# Patient Record
Sex: Female | Born: 1970 | Race: Asian | Hispanic: No | Marital: Married | Smoking: Never smoker
Health system: Southern US, Community
[De-identification: ages and names within clinical notes are randomized; demographics above are authoritative.]

## PROBLEM LIST (undated history)

## (undated) DIAGNOSIS — E559 Vitamin D deficiency, unspecified: Secondary | ICD-10-CM

## (undated) DIAGNOSIS — I10 Essential (primary) hypertension: Secondary | ICD-10-CM

## (undated) DIAGNOSIS — J45909 Unspecified asthma, uncomplicated: Secondary | ICD-10-CM

## (undated) DIAGNOSIS — J302 Other seasonal allergic rhinitis: Secondary | ICD-10-CM

## (undated) DIAGNOSIS — Z973 Presence of spectacles and contact lenses: Secondary | ICD-10-CM

## (undated) DIAGNOSIS — K589 Irritable bowel syndrome without diarrhea: Secondary | ICD-10-CM

## (undated) DIAGNOSIS — N289 Disorder of kidney and ureter, unspecified: Secondary | ICD-10-CM

## (undated) DIAGNOSIS — J4520 Mild intermittent asthma, uncomplicated: Secondary | ICD-10-CM

## (undated) DIAGNOSIS — K219 Gastro-esophageal reflux disease without esophagitis: Secondary | ICD-10-CM

## (undated) DIAGNOSIS — E119 Type 2 diabetes mellitus without complications: Secondary | ICD-10-CM

## (undated) DIAGNOSIS — J309 Allergic rhinitis, unspecified: Secondary | ICD-10-CM

## (undated) HISTORY — PX: FOOT SURGERY: SHX648

## (undated) HISTORY — DX: Type 2 diabetes mellitus without complications: E11.9

## (undated) HISTORY — PX: NO PAST SURGERIES: SHX2092

---

## 1999-09-17 ENCOUNTER — Emergency Department (HOSPITAL_COMMUNITY): Admission: EM | Admit: 1999-09-17 | Discharge: 1999-09-17 | Payer: Self-pay | Admitting: Emergency Medicine

## 2000-05-07 ENCOUNTER — Inpatient Hospital Stay (HOSPITAL_COMMUNITY): Admission: AD | Admit: 2000-05-07 | Discharge: 2000-05-07 | Payer: Self-pay | Admitting: *Deleted

## 2001-01-22 ENCOUNTER — Ambulatory Visit (HOSPITAL_COMMUNITY): Admission: RE | Admit: 2001-01-22 | Discharge: 2001-01-22 | Payer: Self-pay | Admitting: *Deleted

## 2001-03-28 ENCOUNTER — Ambulatory Visit (HOSPITAL_COMMUNITY): Admission: RE | Admit: 2001-03-28 | Discharge: 2001-03-28 | Payer: Self-pay | Admitting: *Deleted

## 2001-08-17 ENCOUNTER — Inpatient Hospital Stay (HOSPITAL_COMMUNITY): Admission: AD | Admit: 2001-08-17 | Discharge: 2001-08-19 | Payer: Self-pay | Admitting: *Deleted

## 2002-08-04 ENCOUNTER — Encounter: Payer: Self-pay | Admitting: Internal Medicine

## 2002-08-04 ENCOUNTER — Ambulatory Visit (HOSPITAL_COMMUNITY): Admission: RE | Admit: 2002-08-04 | Discharge: 2002-08-04 | Payer: Self-pay | Admitting: Internal Medicine

## 2003-01-11 ENCOUNTER — Ambulatory Visit (HOSPITAL_COMMUNITY): Admission: RE | Admit: 2003-01-11 | Discharge: 2003-01-11 | Payer: Self-pay | Admitting: Internal Medicine

## 2003-05-18 ENCOUNTER — Ambulatory Visit (HOSPITAL_COMMUNITY): Admission: RE | Admit: 2003-05-18 | Discharge: 2003-05-18 | Payer: Self-pay | Admitting: Internal Medicine

## 2003-09-06 ENCOUNTER — Emergency Department (HOSPITAL_COMMUNITY): Admission: EM | Admit: 2003-09-06 | Discharge: 2003-09-06 | Payer: Self-pay

## 2003-09-07 ENCOUNTER — Emergency Department (HOSPITAL_COMMUNITY): Admission: EM | Admit: 2003-09-07 | Discharge: 2003-09-08 | Payer: Self-pay | Admitting: Emergency Medicine

## 2003-09-08 ENCOUNTER — Emergency Department (HOSPITAL_COMMUNITY): Admission: EM | Admit: 2003-09-08 | Discharge: 2003-09-08 | Payer: Self-pay | Admitting: Emergency Medicine

## 2003-12-07 ENCOUNTER — Ambulatory Visit: Payer: Self-pay | Admitting: *Deleted

## 2003-12-07 ENCOUNTER — Ambulatory Visit: Payer: Self-pay | Admitting: Nurse Practitioner

## 2004-02-01 ENCOUNTER — Ambulatory Visit: Payer: Self-pay | Admitting: Nurse Practitioner

## 2004-04-20 ENCOUNTER — Ambulatory Visit: Payer: Self-pay | Admitting: Nurse Practitioner

## 2004-05-02 ENCOUNTER — Ambulatory Visit: Payer: Self-pay | Admitting: Nurse Practitioner

## 2004-05-03 ENCOUNTER — Ambulatory Visit (HOSPITAL_COMMUNITY): Admission: RE | Admit: 2004-05-03 | Discharge: 2004-05-03 | Payer: Self-pay | Admitting: Internal Medicine

## 2004-05-17 ENCOUNTER — Ambulatory Visit: Payer: Self-pay | Admitting: Internal Medicine

## 2004-05-18 ENCOUNTER — Ambulatory Visit: Payer: Self-pay | Admitting: Internal Medicine

## 2004-05-19 ENCOUNTER — Ambulatory Visit: Payer: Self-pay | Admitting: Nurse Practitioner

## 2004-06-13 ENCOUNTER — Ambulatory Visit: Payer: Self-pay | Admitting: Obstetrics & Gynecology

## 2004-06-19 ENCOUNTER — Ambulatory Visit: Payer: Self-pay | Admitting: Nurse Practitioner

## 2004-06-19 ENCOUNTER — Ambulatory Visit (HOSPITAL_COMMUNITY): Admission: RE | Admit: 2004-06-19 | Discharge: 2004-06-19 | Payer: Self-pay | Admitting: Internal Medicine

## 2004-06-27 ENCOUNTER — Ambulatory Visit: Payer: Self-pay | Admitting: Obstetrics & Gynecology

## 2004-06-28 ENCOUNTER — Ambulatory Visit: Payer: Self-pay | Admitting: Nurse Practitioner

## 2004-07-25 ENCOUNTER — Ambulatory Visit: Payer: Self-pay | Admitting: Obstetrics & Gynecology

## 2004-08-29 ENCOUNTER — Ambulatory Visit: Payer: Self-pay | Admitting: Obstetrics and Gynecology

## 2004-10-11 ENCOUNTER — Ambulatory Visit: Payer: Self-pay | Admitting: Nurse Practitioner

## 2004-10-11 ENCOUNTER — Ambulatory Visit (HOSPITAL_COMMUNITY): Admission: RE | Admit: 2004-10-11 | Discharge: 2004-10-11 | Payer: Self-pay | Admitting: Nurse Practitioner

## 2004-10-30 ENCOUNTER — Ambulatory Visit (HOSPITAL_COMMUNITY): Admission: RE | Admit: 2004-10-30 | Discharge: 2004-10-30 | Payer: Self-pay | Admitting: Gastroenterology

## 2005-11-13 ENCOUNTER — Ambulatory Visit: Payer: Self-pay | Admitting: Nurse Practitioner

## 2005-12-10 ENCOUNTER — Other Ambulatory Visit: Admission: RE | Admit: 2005-12-10 | Discharge: 2005-12-10 | Payer: Self-pay | Admitting: Nurse Practitioner

## 2005-12-10 ENCOUNTER — Encounter (INDEPENDENT_AMBULATORY_CARE_PROVIDER_SITE_OTHER): Payer: Self-pay | Admitting: *Deleted

## 2005-12-10 ENCOUNTER — Ambulatory Visit: Payer: Self-pay | Admitting: Nurse Practitioner

## 2006-04-24 ENCOUNTER — Ambulatory Visit: Payer: Self-pay | Admitting: Nurse Practitioner

## 2006-05-15 ENCOUNTER — Ambulatory Visit: Payer: Self-pay | Admitting: Nurse Practitioner

## 2006-05-15 ENCOUNTER — Ambulatory Visit (HOSPITAL_COMMUNITY): Admission: RE | Admit: 2006-05-15 | Discharge: 2006-05-15 | Payer: Self-pay | Admitting: Nurse Practitioner

## 2006-06-10 ENCOUNTER — Ambulatory Visit: Payer: Self-pay | Admitting: Nurse Practitioner

## 2006-06-20 ENCOUNTER — Ambulatory Visit: Payer: Self-pay | Admitting: Nurse Practitioner

## 2006-11-06 ENCOUNTER — Encounter (INDEPENDENT_AMBULATORY_CARE_PROVIDER_SITE_OTHER): Payer: Self-pay | Admitting: *Deleted

## 2007-04-23 ENCOUNTER — Emergency Department (HOSPITAL_COMMUNITY): Admission: EM | Admit: 2007-04-23 | Discharge: 2007-04-23 | Payer: Self-pay | Admitting: Emergency Medicine

## 2007-05-08 ENCOUNTER — Emergency Department (HOSPITAL_COMMUNITY): Admission: EM | Admit: 2007-05-08 | Discharge: 2007-05-09 | Payer: Self-pay | Admitting: Emergency Medicine

## 2007-11-10 ENCOUNTER — Emergency Department (HOSPITAL_COMMUNITY): Admission: EM | Admit: 2007-11-10 | Discharge: 2007-11-11 | Payer: Self-pay | Admitting: Emergency Medicine

## 2010-03-12 ENCOUNTER — Encounter: Payer: Self-pay | Admitting: Obstetrics & Gynecology

## 2010-05-22 ENCOUNTER — Emergency Department (HOSPITAL_COMMUNITY): Payer: Self-pay

## 2010-05-22 ENCOUNTER — Inpatient Hospital Stay (HOSPITAL_COMMUNITY)
Admission: EM | Admit: 2010-05-22 | Discharge: 2010-05-27 | DRG: 690 | Disposition: A | Discharge status: Home / Self Care | Payer: Self-pay | Attending: Internal Medicine | Admitting: Internal Medicine

## 2010-05-22 DIAGNOSIS — I10 Essential (primary) hypertension: Secondary | ICD-10-CM | POA: Diagnosis present

## 2010-05-22 DIAGNOSIS — G43909 Migraine, unspecified, not intractable, without status migrainosus: Secondary | ICD-10-CM | POA: Diagnosis present

## 2010-05-22 DIAGNOSIS — D509 Iron deficiency anemia, unspecified: Secondary | ICD-10-CM | POA: Diagnosis present

## 2010-05-22 DIAGNOSIS — B9689 Other specified bacterial agents as the cause of diseases classified elsewhere: Secondary | ICD-10-CM | POA: Diagnosis present

## 2010-05-22 DIAGNOSIS — N12 Tubulo-interstitial nephritis, not specified as acute or chronic: Principal | ICD-10-CM | POA: Diagnosis present

## 2010-05-22 DIAGNOSIS — J45909 Unspecified asthma, uncomplicated: Secondary | ICD-10-CM | POA: Diagnosis present

## 2010-05-22 DIAGNOSIS — A499 Bacterial infection, unspecified: Secondary | ICD-10-CM | POA: Diagnosis present

## 2010-05-22 DIAGNOSIS — R109 Unspecified abdominal pain: Secondary | ICD-10-CM | POA: Diagnosis present

## 2010-05-22 DIAGNOSIS — N760 Acute vaginitis: Secondary | ICD-10-CM | POA: Diagnosis present

## 2010-05-22 DIAGNOSIS — R5381 Other malaise: Secondary | ICD-10-CM | POA: Diagnosis not present

## 2010-05-22 DIAGNOSIS — E860 Dehydration: Secondary | ICD-10-CM | POA: Diagnosis present

## 2010-05-22 DIAGNOSIS — R110 Nausea: Secondary | ICD-10-CM | POA: Diagnosis present

## 2010-05-22 DIAGNOSIS — E876 Hypokalemia: Secondary | ICD-10-CM | POA: Diagnosis present

## 2010-05-22 LAB — BASIC METABOLIC PANEL
BUN: 8 mg/dL (ref 6–23)
CO2: 27 mEq/L (ref 19–32)
Calcium: 9.2 mg/dL (ref 8.4–10.5)
Chloride: 104 mEq/L (ref 96–112)
Creatinine, Ser: 0.56 mg/dL (ref 0.4–1.2)
GFR calc Af Amer: >60 mL/min (ref >60)

## 2010-05-22 LAB — DIFFERENTIAL
Eosinophils Relative: 0 % (ref 0–5)
Lymphs Abs: 1.8 10*3/uL (ref 0.7–4.0)

## 2010-05-22 LAB — URINALYSIS, ROUTINE W REFLEX MICROSCOPIC: pH: 7 (ref 5.0–8.0)

## 2010-05-22 LAB — POCT PREGNANCY, URINE: Preg Test, Ur: NEGATIVE

## 2010-05-22 LAB — URINE MICROSCOPIC-ADD ON

## 2010-05-22 LAB — CBC
HCT: 33 % — ABNORMAL LOW (ref 36.0–46.0)
Hemoglobin: 11.1 g/dL — ABNORMAL LOW (ref 12.0–15.0)
MCV: 87.5 fL (ref 78.0–100.0)
Platelets: 264 10*3/uL (ref 150–400)
RBC: 3.77 MIL/uL — ABNORMAL LOW (ref 3.87–5.11)

## 2010-05-22 LAB — WET PREP, GENITAL: Yeast Wet Prep HPF POC: NONE SEEN

## 2010-05-23 ENCOUNTER — Other Ambulatory Visit (HOSPITAL_COMMUNITY): Payer: Self-pay

## 2010-05-23 ENCOUNTER — Inpatient Hospital Stay (HOSPITAL_COMMUNITY): Payer: Self-pay

## 2010-05-23 LAB — COMPREHENSIVE METABOLIC PANEL
ALT: 26 U/L (ref 0–35)
AST: 20 U/L (ref 0–37)
Alkaline Phosphatase: 63 U/L (ref 39–117)
BUN: 4 mg/dL — ABNORMAL LOW (ref 6–23)
CO2: 23 mEq/L (ref 19–32)
GFR calc Af Amer: >60 mL/min (ref >60)
Total Bilirubin: 0.5 mg/dL (ref 0.3–1.2)

## 2010-05-23 LAB — FOLATE: Folate: 13.7 ng/mL

## 2010-05-23 LAB — CBC
Hemoglobin: 9 g/dL — ABNORMAL LOW (ref 12.0–15.0)
MCH: 29.3 pg (ref 26.0–34.0)
RBC: 3.04 MIL/uL — ABNORMAL LOW (ref 3.87–5.11)

## 2010-05-23 LAB — IRON AND TIBC
Saturation Ratios: 4 % — ABNORMAL LOW (ref 20–55)
UIBC: 241 ug/dL

## 2010-05-23 LAB — DIFFERENTIAL
Basophils Absolute: 0 10*3/uL (ref 0.0–0.1)
Basophils Relative: 0 % (ref 0–1)
Eosinophils Absolute: 0.1 10*3/uL (ref 0.0–0.7)
Neutrophils Relative %: 68 % (ref 43–77)

## 2010-05-23 LAB — PATHOLOGIST SMEAR REVIEW

## 2010-05-23 LAB — FERRITIN: Ferritin: 65 ng/mL (ref 10–291)

## 2010-05-24 LAB — CARDIAC PANEL(CRET KIN+CKTOT+MB+TROPI)
Relative Index: INVALID (ref 0.0–2.5)
Relative Index: INVALID (ref 0.0–2.5)
Total CK: 32 U/L (ref 7–177)

## 2010-05-24 LAB — CBC
HCT: 32.4 % — ABNORMAL LOW (ref 36.0–46.0)
Hemoglobin: 10.6 g/dL — ABNORMAL LOW (ref 12.0–15.0)
MCH: 28.7 pg (ref 26.0–34.0)
MCV: 87.8 fL (ref 78.0–100.0)
RBC: 3.69 MIL/uL — ABNORMAL LOW (ref 3.87–5.11)

## 2010-05-24 LAB — COMPREHENSIVE METABOLIC PANEL
ALT: 24 U/L (ref 0–35)
AST: 15 U/L (ref 0–37)
CO2: 25 mEq/L (ref 19–32)
Chloride: 104 mEq/L (ref 96–112)
Creatinine, Ser: 0.46 mg/dL (ref 0.4–1.2)
GFR calc Af Amer: >60 mL/min (ref >60)
GFR calc non Af Amer: >60 mL/min (ref >60)
Glucose, Bld: 133 mg/dL — ABNORMAL HIGH (ref 70–99)
Total Bilirubin: 0.4 mg/dL (ref 0.3–1.2)

## 2010-05-24 LAB — URINE CULTURE

## 2010-05-24 LAB — DIFFERENTIAL
Basophils Relative: 1 % (ref 0–1)
Lymphocytes Relative: 22 % (ref 12–46)
Lymphs Abs: 1.6 10*3/uL (ref 0.7–4.0)
Monocytes Absolute: 0.7 10*3/uL (ref 0.1–1.0)
Monocytes Relative: 10 % (ref 3–12)
Neutro Abs: 4.7 10*3/uL (ref 1.7–7.7)
Neutrophils Relative %: 65 % (ref 43–77)

## 2010-05-24 LAB — MAGNESIUM: Magnesium: 2.1 mg/dL (ref 1.5–2.5)

## 2010-05-25 ENCOUNTER — Inpatient Hospital Stay (HOSPITAL_COMMUNITY): Payer: Self-pay

## 2010-05-25 LAB — DIFFERENTIAL
Basophils Absolute: 0.1 10*3/uL (ref 0.0–0.1)
Basophils Relative: 1 % (ref 0–1)
Neutro Abs: 3.7 10*3/uL (ref 1.7–7.7)
Neutrophils Relative %: 53 % (ref 43–77)

## 2010-05-25 LAB — CARDIAC PANEL(CRET KIN+CKTOT+MB+TROPI)
CK, MB: 0.9 ng/mL (ref 0.3–4.0)
Relative Index: INVALID (ref 0.0–2.5)
Total CK: 29 U/L (ref 7–177)
Troponin I: 0.01 ng/mL (ref 0.00–0.06)

## 2010-05-25 LAB — CBC
Hemoglobin: 10.6 g/dL — ABNORMAL LOW (ref 12.0–15.0)
RBC: 3.63 MIL/uL — ABNORMAL LOW (ref 3.87–5.11)

## 2010-05-25 LAB — COMPREHENSIVE METABOLIC PANEL
AST: 18 U/L (ref 0–37)
Albumin: 2.8 g/dL — ABNORMAL LOW (ref 3.5–5.2)
Calcium: 7.7 mg/dL — ABNORMAL LOW (ref 8.4–10.5)
Creatinine, Ser: 0.55 mg/dL (ref 0.4–1.2)
GFR calc Af Amer: >60 mL/min (ref >60)
GFR calc non Af Amer: >60 mL/min (ref >60)

## 2010-05-26 LAB — COMPREHENSIVE METABOLIC PANEL
ALT: 27 U/L (ref 0–35)
AST: 26 U/L (ref 0–37)
Alkaline Phosphatase: 71 U/L (ref 39–117)
CO2: 22 mEq/L (ref 19–32)
Chloride: 111 mEq/L (ref 96–112)
Creatinine, Ser: 0.41 mg/dL (ref 0.4–1.2)
GFR calc Af Amer: >60 mL/min (ref >60)
GFR calc non Af Amer: >60 mL/min (ref >60)
Total Bilirubin: 0.5 mg/dL (ref 0.3–1.2)

## 2010-05-26 LAB — CBC
Hemoglobin: 12.1 g/dL (ref 12.0–15.0)
MCH: 28.9 pg (ref 26.0–34.0)
MCV: 89.7 fL (ref 78.0–100.0)
RBC: 4.18 MIL/uL (ref 3.87–5.11)

## 2010-05-27 NOTE — Discharge Summary (Signed)
[name], [name]                    ACCOUNT NO.:  [id number]  MEDICAL RECORD NO.:  [id number]           PATIENT TYPE:  I  LOCATION:  1402                         FACILITY:  [facility]  PHYSICIAN:  Talmage Nap, MD  DATE OF BIRTH:  Jan 19, 1971  DATE OF ADMISSION:  05/22/2010 DATE OF DISCHARGE:  05/27/2010                        DISCHARGE SUMMARY - REFERRING   PRIMARY CARE PHYSICIAN:  Unassigned.  DISCHARGE DIAGNOSES: 1. Urinary tract infection (left pyelonephritis). 2. Acute migraine attack. 3. Asthma. 4. Questionable hypertension. 5. Hypokalemia, corrected on discharge. 6. Iron-deficiency anemia.  HISTORY:  The patient is a 40 year old Asian lady with a history of asthma and hypertension who was admitted to the hospital on May 22, 2010, by Dr. Therisa Doyne with complaint of abdominal pain and dysuria which had been on and off for about a month but got progressively worse.  The patient was also said to have noticed occasional blood clots in the toilet and in her underwear.  A week prior to presenting to the emergency room, dysuria was said to have gotten progressively worse and she claims that she had fever with myalgias. There was no history of chills.  No history of vomiting.  No history of diarrhea and subsequently she presented to the hospital for evaluation.  MEDICATIONS:  Her preadmission medications questionable hydrochlorothiazide.  ALLERGIES:  SHE HAS NO KNOWN ALLERGIES.  SOCIAL HISTORY:  Social history is negative for alcohol or tobacco use.  FAMILY HISTORY:  [name] to be noncontributory.  REVIEW OF SYSTEMS:  Essentially documented in the initial history and physical.  PHYSICAL EXAMINATION:  VITAL SIGNS:  At time the patient was seen by the admitting physician, T-max 102.9, blood pressure was 120/88, pulse 102. Initially it was said to be 94.  Respiratory rate was 17, saturation 100% on room air. HEENT:  The pupils were reactive to light and extraocular  muscles are intact. NECK:  No jugular venous distention.  No carotid bruit.  No lymphadenopathy. RESPIRATORY:  Chest was clear to auscultation. CARDIOVASCULAR:  Heart sounds are 1 and 2. ABDOMEN:  Said to be soft with tenderness in the left lower quadrant. Liver, spleen and kidney not palpable.  Bowel sounds are positive. EXTREMITIES:  No pedal edema. NEUROLOGIC EXAMINATION:  Nonfocal. MUSCULOSKELETAL SYSTEM:  Unremarkable. PELVIC EXAMINATION:  Deferred. NEUROPSYCHIATRIC:  Evaluation was unremarkable.  LABORATORY DATA:  Pregnancy test negative.  Urinalysis showed small leukocyte esterase with negative nitrite.  Urine microscopy showed WBC 11-20 with few bacteria.  Basic metabolic panel showed sodium of 137, potassium of 3.1, chloride of 104 with a bicarbonate of 37.  Glucose is 111.  BUN is 8, creatinine 0.56.  Initial complete blood count with differential showed WBC of 16.5, hemoglobin 11.1, hematocrit of 33.0, MCV 87.5 with a platelet count of 264, neutrophils 70 and absolute neutrophil count 11.6.  Wet prep showed moderate WBC.  No Trichomonas seen with few clue cells.  Yeast not seen.  Probe for GC and Chlamydia negative.  Anemia panel showed serum iron 10, total iron binding capacity is 251, percentage saturation is for UIBC 241.  Folate is 13.7. Urine culture grew  Escherichia coli sensitive to ampicillin, cefazolin, ceftriaxone, ciprofloxacin, gentamicin, levofloxacin, nitrofurantoin, Tobramycin and Bactrim.  Blood culture x2 no growth.  Magnesium level done on April 4 is 2.1.  Three sets of cardiac markers done from April 4 to May 25, 2010, are as follows:  Troponin-I 0.01, 0.01 and 0.01 respectively.  A repeat complete blood count with no differential done on May 26, 2010, showed WBC of 5.4, hemoglobin of 12.1, hematocrit of 37.5, MCV of 89.7 with a platelet count of 428.  Comprehensive metabolic panel showed sodium of 130, potassium of 4.3, chloride of 111 with  a bicarbonate of 32.  Glucose is 111.  BUN is 5, creatinine is 0.41.  LFT normal.  Magnesium level is 2.49.  IMAGING STUDIES:  Imaging studies done on the patient include renal ultrasound normal.  CT of the abdomen and pelvis without contrast showed asymmetric left-sided perinephric fat stranding, pelvocaliectasis and hydroureter.  There is no evidence for obstruction or stone.  There is borderline enlarged periaortic lymph node, likely reactive.  CT of the head without contrast done on May 25, 2010, which showed no evidence of acute intracranial hemorrhage, acute infarct or mass lesion.  HOSPITAL COURSE:  The patient was admitted to telemetry.  She was started on normal saline with 10 mEq of KCl to go at a rate of 200 cc an hour and then Protonix 40 mg p.o. daily and she was started on Zosyn 3.35 grams IV q.8 hours.  In addition, the patient was also given KCl 10 mEq p.o. times one dose.  The patient was said to have been nauseated and was also given Phenergan 6.25 mg IV times one dose.  Other medications given to the patient include Mag-Ox 400 mg p.o. daily and Flagyl 200 mg p.o. times one dose.  The patient was also given nebulizer treatment p.r.n.  The patient was seen by me for the very first time on May 24, 2010, and during this encounter the patient continued to complain about suprapubic discomfort and also that the Tylenol given to her was not helping her fever and subsequently Zosyn was discontinued based on the report of the antibiogram and the patient was placed on Cipro 400 mg IV q.12 hours for the treatment of the pyelonephritis as well as UTI and ibuprofen 600 mg p.o. t.i.d. for the control of fever. She was also continued on albuterol and Atrovent nebulizers q.4 p.r.n. for shortness of breath or acute asthmatic attack.  Throughout this hospitalization the patient, however, continued to complain about headaches and photophobia and a followup CT done was unremarkable.   She was, however, started on treatment for acute migrainous headache with Treximet one p.o. b.i.d. and also propranolol 40 mg p.o. daily.  Also added to the patient's regimen was Topamax 50 mg p.o. b.i.d.  Following the adjustment made in the patient's treatment for her migrainous headache, the headache started showing gradual resolution.  The patient, however, made remarkable progress during this hospitalization.  She was followed and evaluated by me on a daily basis.  She also had Physical Therapy consult done for gradual ambulation.  The patient was seen by me today, which is May 27, 2010, with complaint of a mild headache. Examination of the patient was negative for any neck stiffness. Kernig's as well as Brudzinski's signs were all negative.  Vital Signs:  Blood pressure is 130/85, pulse 56, respiratory rate 18, temperature is 98.1.  Medically stable.  PLAN:  The plan is for the patient to be discharged  home today on activity as tolerated.  DIET:  Low-sodium, low-cholesterol diet.  FOLLOWUP:  She will follow up with her primary care physician, that is Dr. Andrey Campanile at [facility], on Jul 14, 2010, at 10:30 a.m.  Telephone number is [phone].  MEDICATIONS:  Medications to be taken at home will include: 1. Cipro 500 mg p.o. daily for the next 5 days. 2. Propranolol 20 mg one p.o. b.i.d. 3. Sumatriptan 100 mg one p.o. b.i.d. p.r.n. for acute attack of     migraine. 4. Tolpyrramide 100 mg one p.o. at night. 5. Albuterol inhaler two puffs q.4 p.r.n. 6. Allergy eyedrops naphazoline ophthalmic one drop both eyes p.r.n. 7. Cold/Flu relief acetaminophen/dextromethorphan/phenylephrine     325/10/5 two capsules p.o. p.r.n. 8. Vitamin B12 cyanocobalamin one p.o. daily. 9. The patient was also advised to get over-the-counter iron p.o. and     she was agreeable to that.     Talmage Nap, MD     CN/MEDQ  D:  05/27/2010  T:  05/27/2010  Job:  [id number]  cc:   Dr.  Gay Filler  Electronically Signed by Talmage Nap  on 05/27/2010 02:17:28 PM

## 2010-05-29 LAB — CULTURE, BLOOD (ROUTINE X 2)
Culture  Setup Time: 201204030154
Culture: NO GROWTH

## 2010-05-31 NOTE — H&P (Signed)
[name], [name]                    ACCOUNT NO.:  [id number]  MEDICAL RECORD NO.:  [id number]           PATIENT TYPE:  I  LOCATION:  1402                         FACILITY:  [facility]  PHYSICIAN:  Michiel Cowboy, MDDATE OF BIRTH:  [birth year]  DATE OF ADMISSION:  05/22/2010 DATE OF DISCHARGE:                             HISTORY & PHYSICAL   PRIMARY CARE PROVIDER:  None.  CHIEF COMPLAINT:  Abdominal pain and dysuria.  The patient is a 40 year old female with history of hypertension and asthma for which she has not been taking anything.  She states that for about a month or even more, she had been having burning with urination, with occasional blood clots in the toilet and in underwear.  She also states that with sexual activity the bleeding becomes worse.  For the past 1 week her dysuria has become more significant.  She started to develop myalgias and back pain.  Today, she also had a fever up to 103 at home and started to feel poorly.  She brought her in to emergency department.  Of note, the patient does have history of chronic migraines which is very frequent and currently has one.  REVIEW OF SYSTEMS:  Significant for some nausea, no vomiting, some lightheadedness, mild cough, no runny nose, otherwise review of systems unremarkable.  PAST MEDICAL HISTORY:  Significant for, 1. Asthma. 2. Hypertension for which she supposed to take hydrochlorothiazide,     but she is not taking it. 3. Possibility of urinary tract infections in the past.  SOCIAL HISTORY:  The patient does not smoke or drink or abuse drugs.  FAMILY HISTORY:  Noncontributory.  ALLERGIES:  None.  MEDICATIONS:  She is supposed to complete on hydrochlorothiazide, but does not.  PHYSICAL EXAMINATION:  VITAL SIGNS:  T-max 102.9, blood pressure 129/88, pulse 102 initially now down to 94, respirations 17, saturating 100% on room air. GENERAL:  The patient appears to be in no acute distress currently. HEENT:   Head, nontraumatic, moist mucous membranes. LUNGS:  Clear to auscultation bilaterally. HEART:  Regular rate and rhythm.  No murmurs appreciated. ABDOMEN:  Soft.  There is some left lower quadrant tenderness. VAGINAL:  Deferred until ER physician can perform it using appropriate instruments. LOWER EXTREMITIES:  Without clubbing, cyanosis, or edema. NEUROLOGIC:  Intact. NECK:  Supple.  There is some muscle tenderness along the left side of the neck, reproducible on palpation.  LABORATORY DATA:  White blood cell count 16.5, hemoglobin 11.1, platelets 264.  Sodium 137, potassium 3.1, creatinine 0.6.  Pregnancy negative.  UA showed 11-20 white blood cells and 7-10 red blood cells with few bacteria.  ASSESSMENT AND PLAN:  This is a 40 year old female with history of abdominal discomfort, dysuria, urinary tract infection, and myalgia and also possible either vaginal bleeding versus hematuria.  Urinary tract infection/abdominal pain.  Intraabdominal pain is somewhat atypical for cystitis, it more localized to the left.  Given high fever and overall feeling poorly, we will for right now cover broadly with Zosyn until results of CT scan are back.  We will obtain CT scan of abdomen and  pelvis with contrast.  She already has received renal ultrasound, results of which are pending.  Headache, likely migraine.  The patient has a history of this with repeated occurrences.  Low potassium.  We will replace.  History of hypertension.  Hold off on hydrochlorothiazide for now, give aggressive IV fluids, and watch for early sepsis.  Currently, the patient is hemodynamically stable.  History of asthma.  Make sure she has albuterol p.r.n., currently stable.  Question vaginal bleeding.  ED physician will perform vaginal exam once able to get all the supplies to TCU.  Prophylaxis.  Protonix and SCDs.     Michiel Cowboy, MD     AVD/MEDQ  D:  05/22/2010  T:  05/23/2010  Job:   [id number]  Electronically Signed by Therisa Doyne MD on 05/31/2010 08:43:00 PM

## 2010-07-07 NOTE — Group Therapy Note (Signed)
NAMETIJAH, HANE                    ACCOUNT NO.:  [id number]   MEDICAL RECORD NO.:  [id number]          PATIENT TYPE:  WOC   LOCATION:  [facility]                   FACILITY:  [facility]   PHYSICIAN:  Argentina Donovan, MD        DATE OF BIRTH:  19-Jul-1970   DATE OF SERVICE:                                    CLINIC NOTE   This patient is a 40 year old [place] lady who has been seen by Dr.  Penne Lash with a history of abdominal bloating and pain. She had an ultrasound  and both ovaries looked normal a short time ago, but she continues having  this problem. In addition she complains of 3 week's duration of dizziness.  She has been on Yasmin, has bled through the entire cycle.  Starting a new  pack, the bleeding has eventually stopped. Will continue on that. May have  to change the pill if that happens. I am going to evaluate her for H. pylori  and get a metabolic panel on her as well as refer her to a  gastroenterologist as looking at her abdomen, it is definitely bloated and  distended to some degree. She says this gets worse every time she eats. I  think the possibility of inflammatory bowel disease or spastic colon or even  ulcers is a very possible likelihood. Will call the patient with the  results.      PR/MEDQ  D:  07/25/2004  T:  07/26/2004  Job:  [id number]

## 2010-07-07 NOTE — Group Therapy Note (Signed)
[name], [name]                    ACCOUNT NO.:  [id number]   MEDICAL RECORD NO.:  [id number]          PATIENT TYPE:  WOC   LOCATION:  [facility]                   FACILITY:  [facility]   PHYSICIAN:  Elsie Lincoln, MD      DATE OF BIRTH:  01/01/1971   DATE OF SERVICE:                                    CLINIC NOTE   HISTORY OF PRESENT ILLNESS:  The patient is a 40 year old G3, P2-0-1-2, LMP  May 17, 2004, who was sent to [place] from [facility] for continued left-sided  pelvic pain.  The patient has had painful menses for the last 3 years, and  the past couple of months has had increasing left lower quadrant abdominal  pain.  The patient denies nausea, vomiting or diarrhea or change in bladder  habits.  No dysuria.  She had a CT done on  May 03, 2004 with resolution of previous inflammatory changes involving  the terminal ileum and appendix as well as mildly progressive, diffuse  adenomyosis and/or myomatous change in the uterus and possible hemorrhagic  cyst in the left ovary or possible endometriosis.  The patient states she  has had painful menses for the past 3 years and that her menstruation lasts  for 10 days and then stops for  4-5 days and then she spots with some light pink discharge for several days.  This happens almost every month.  The patient does have an IUD placed for  the past 3 years.  The patient also complains of vaginal discharge  occasionally and with a strong odor.  She also has vaginal itching.  She has  been treated for this type of infection in the past, and does better with  medication.  She does not know whether it is yeast or VD.  Also, today, of  note, the patient has an upper respiratory infection with a rhinorrhea, and  had a fever last night of 101.0.  The patient is afebrile today.   PAST MEDICAL HISTORY:  Asthma, and questionable hypertension.   PAST SURGICAL HISTORY:  Denies.   PAST GYNECOLOGICAL HISTORY:  Possible ovarian cyst several years ago  that  resolved, IUD for the past 3 years for contraception, and menstrual history  as described above.  No history of STDs.  No history of abnormal Pap smears,  and the patient did not realize she has adeno or fibroids.  She is sexually  active in a monogamous relationship.  She does have dyspareunia on deep  penetration.  She says she had a normal spontaneous vaginal delivery and 1  SAB.   ALLERGIES:  None.   MEDICATIONS:  None.   FAMILY HISTORY:  Denies diabetes, heart disease, heart attack, high blood  pressure, cancer or blood clots.   REVIEW OF SYSTEMS:  The patient does have upper respiratory symptoms today  as described above.  No chest pain or shortness of breath.  She does have  abdominal pain as described above.  No nausea, vomiting or diarrhea or  dysuria.   PHYSICAL EXAMINATION:  VITAL SIGNS:  Blood pressure 125/79, weight 121.4.  ABDOMEN:  Soft, nontender, nondistended.  GENITALIA:  Tanner 5.  VAGINA:  Pink, normal rugae.  CERVIX:  Patulous, large, and bleeds upon insertion of speculum.  The  patient also bled further with introduction of cotton Q-Tip that was  utilized to obtain a GC and chlamydia culture.  IUD string present.  BIMANUAL:  Uterus mobile, retroverted, globular.  Adnexa tender on the left  to deep palpation.  No discrete ovary or mass felt.  Right adnexa with no  masses, nontender.  RECTUM:  Positive hemorrhoids, and the patient is asymptomatic with these as  well.   ASSESSMENT:  A 40 year old female with left lower quadrant pain and possible  adnexal mass.   PLAN:  1.  GC and chlamydia and wet prep done.  2.  We will get Pap smear results from [facility].  3.  Transvaginal ultrasound to evaluate adnexa.  4.  Return to clinic in 2 weeks for results.  5.  Consider removing IUD, and placing on oral contraceptives as this would      stop some of her abnormal uterine bleeding as well as if she has      endometriosis could alleviate some of her  pelvic pain symptoms.  If      medical therapy fails, could consider diagnostic laparoscopy.      KL/MEDQ  D:  06/13/2004  T:  06/13/2004  Job:  [id number]

## 2010-07-07 NOTE — Op Note (Signed)
NAME:  [name], [name]                    ACCOUNT NO.:  [id number]   MEDICAL RECORD NO.:  [id number]          PATIENT TYPE:  AMB   LOCATION:  ENDO                         FACILITY:  [facility]   PHYSICIAN:  Graylin Shiver, M.D.   DATE OF BIRTH:  11-Feb-1971   DATE OF PROCEDURE:  10/30/2004  DATE OF DISCHARGE:                                 OPERATIVE REPORT   INDICATION:  Rectal bleeding, abdominal bloating.   Informed consent was obtained after explanation of the risks of bleeding,  infection and perforation.   PREMEDICATION:  Fentanyl 90 mcg IV, Versed 9 milligrams IV.   PROCEDURE:  With the patient in the left lateral decubitus position, a  rectal exam was performed. No masses were felt. The Olympus colonoscope was  inserted into the rectum and advanced around the colon to the cecum. Cecal  landmarks were identified. The cecum and ascending colon were normal. The  transverse colon normal. The descending colon, sigmoid and rectum were  normal. There were some small internal hemorrhoids. She tolerated the  procedure well without complications.   IMPRESSION:  Normal colonoscopy to the cecum with some small internal  hemorrhoids.           ______________________________  Graylin Shiver, M.D.     SFG/MEDQ  D:  10/30/2004  T:  10/30/2004  Job:  [id number]   cc:   Michele Mcalpine D. Okey Dupre, M.D.  Fax: [phone]

## 2010-07-07 NOTE — Group Therapy Note (Signed)
[name], [name]                    ACCOUNT NO.:  [id number]   MEDICAL RECORD NO.:  [id number]          PATIENT TYPE:  WOC   LOCATION:  [facility]                   FACILITY:  [facility]   PHYSICIAN:  Elsie Lincoln, MD      DATE OF BIRTH:  [birth year]   DATE OF SERVICE:  06/27/2004                                    CLINIC NOTE   REASON FOR VISIT:  The patient is a 40 year old female with 3 years of  chronic pelvic pain. She is here for follow-up after transvaginal  ultrasound. The ultrasound revealed a normal uterus measuring 10 x 5.7 x 6.2  with an IUD in situ. There was also normal right and left ovaries and no  free fluid. The patient also still has her upper respiratory infection. She  recently saw a primary care physician who gave her a shot of some type of  antibiotic and she is supposed to follow up if she does not feel any better.  She has had this cold now several weeks and I suggested if she does not feel  better by the end of the week to go back to her primary care physician.  Recently, the patient says she has been having bloating and diarrhea for the  past 2 months. If this is not resolved after her viral illness is over we  can send her to a GI guy and possibly get a workup for irritable bowel  syndrome. This could account for also her pain. In the meantime, if this is  endometriosis - because her pain is mostly surrounding her menses - we will  try a trial of OCPs. The patient was given two samples of Yasmin and we will  see how she does for 8 weeks. If this works and her pain is better, then we  will continue her on Yasmin and possibly remove the IUD. If this does not  work I think a GI evaluation would be nice before going on to diagnostic  laparoscopy. The patient is to return in 8 weeks. She also is going to be  treated for BV with Flagyl 500 mg p.o. b.i.d.; a prescription was given.      KL/MEDQ  D:  06/27/2004  T:  06/27/2004  Job:  [id number]

## 2010-11-13 LAB — CBC
HCT: 38
Hemoglobin: 13.1
MCHC: 34.3
MCV: 91.7
Platelets: 378
RBC: 4.15
RDW: 13.1
WBC: 5.1

## 2010-11-13 LAB — DIFFERENTIAL
Basophils Absolute: 0.1
Basophils Relative: 1
Eosinophils Absolute: 0.1
Eosinophils Relative: 2
Lymphocytes Relative: 49 — ABNORMAL HIGH
Lymphs Abs: 2.5
Monocytes Absolute: 0.5
Monocytes Relative: 9
Neutro Abs: 1.9
Neutrophils Relative %: 38 — ABNORMAL LOW

## 2010-11-13 LAB — BASIC METABOLIC PANEL
GFR calc non Af Amer: >60
Glucose, Bld: 84
Potassium: 3.2 — ABNORMAL LOW
Sodium: 138

## 2010-11-13 LAB — BASIC METABOLIC PANEL WITH GFR
BUN: 13
CO2: 27
Calcium: 8.8
Chloride: 103
Creatinine, Ser: 0.55
GFR calc Af Amer: >60

## 2010-11-20 LAB — URINE MICROSCOPIC-ADD ON

## 2010-11-20 LAB — DIFFERENTIAL
Basophils Relative: 0
Eosinophils Absolute: 0
Lymphocytes Relative: 4 — ABNORMAL LOW
Neutrophils Relative %: 87 — ABNORMAL HIGH

## 2010-11-20 LAB — URINALYSIS, ROUTINE W REFLEX MICROSCOPIC
Nitrite: POSITIVE — AB
Specific Gravity, Urine: 1.009
Urobilinogen, UA: 1

## 2010-11-20 LAB — CBC
HCT: 36.8
MCV: 92.7
Platelets: 263
RDW: 12

## 2010-11-20 LAB — COMPREHENSIVE METABOLIC PANEL
Albumin: 3.5
BUN: 12
Creatinine, Ser: 0.72
Total Bilirubin: 1.3 — ABNORMAL HIGH
Total Protein: 6.8

## 2012-03-02 IMAGING — CT CT ABD-PELV W/O CM
1 of 2 series · 13 of 32 positions shown, 19 images · non-contrast
Comparison: 05/22/2010

CLINICAL DATA: Abdominal pain and hematuria

CT ABDOMEN AND PELVIS WITHOUT CONTRAST
TECHNIQUE: Multidetector CT imaging of the abdomen and pelvis was
performed following the standard protocol without intravenous
contrast.

[Series 2: under 200# stone no prev · axial · 0.57mm/px · z∈[-380,+0]mm · 13 of 88 slices shown, 19 images]
[im 6/88  soft-tissue]
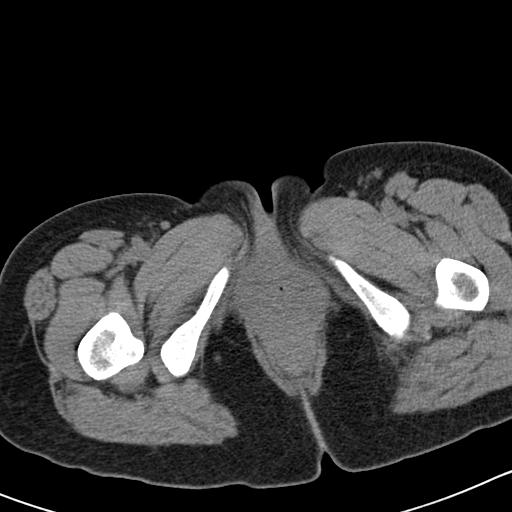
[im 6/88  bone]
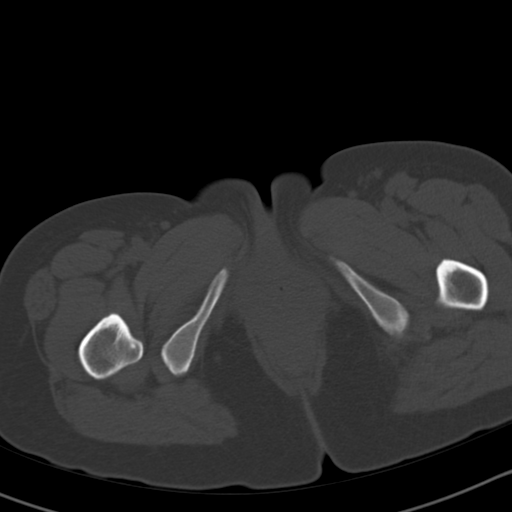
[im 12/88  soft-tissue]
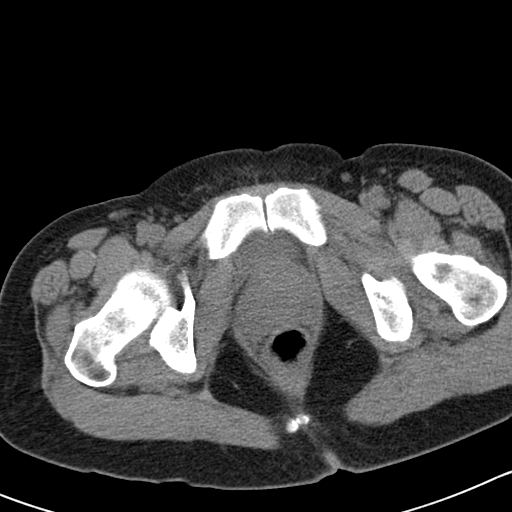
[im 18/88  soft-tissue]
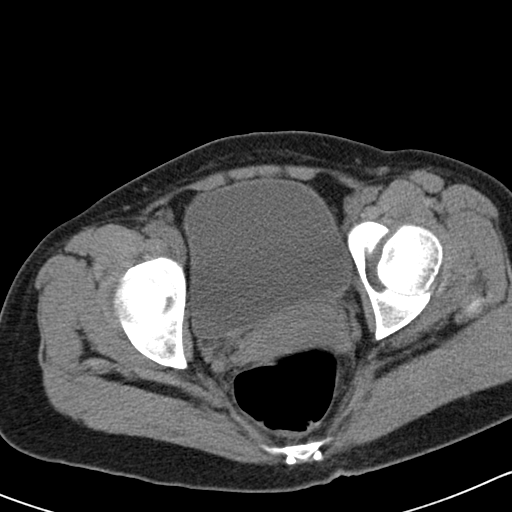
[im 24/88  soft-tissue]
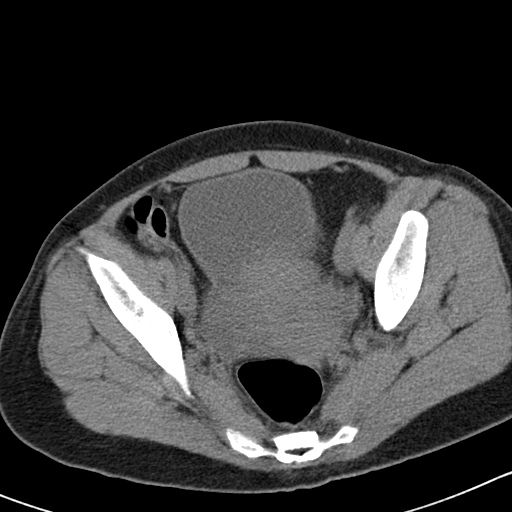
[im 30/88  soft-tissue]
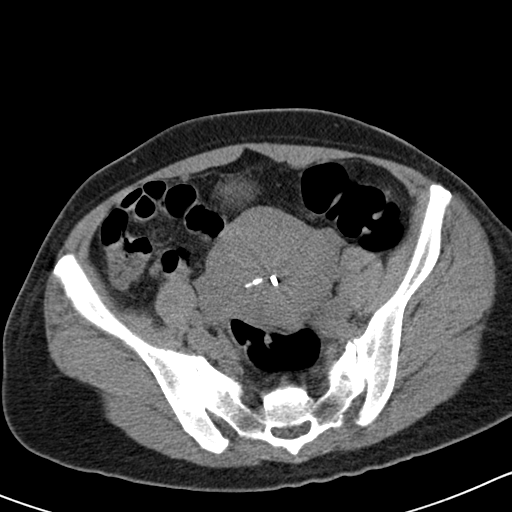
[im 35/88  soft-tissue]
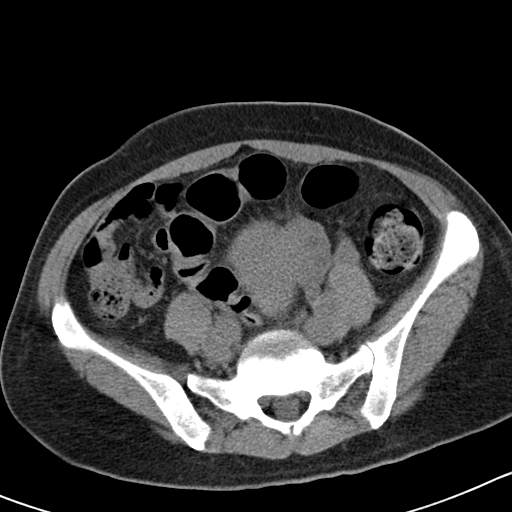
[im 47/88  soft-tissue]
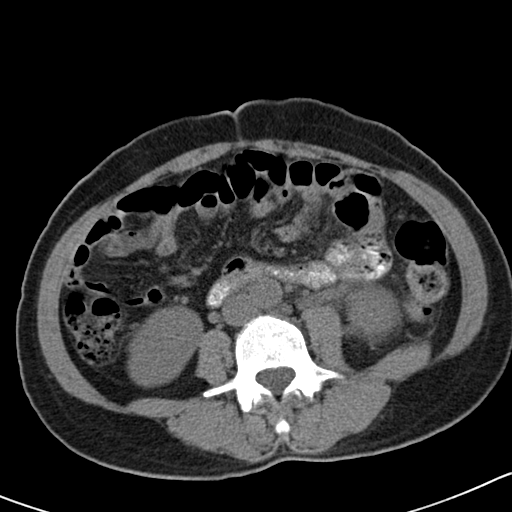
[im 53/88  soft-tissue]
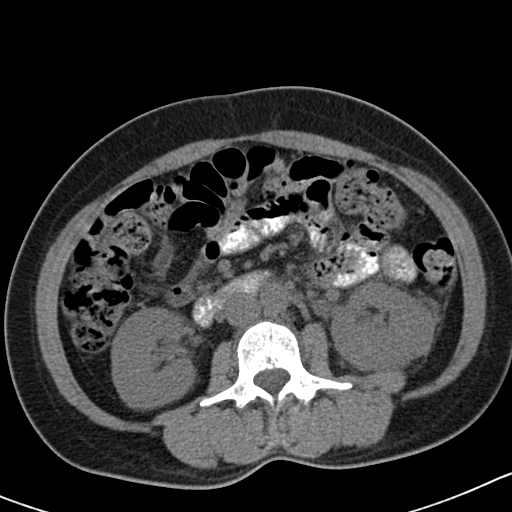
[im 59/88  soft-tissue]
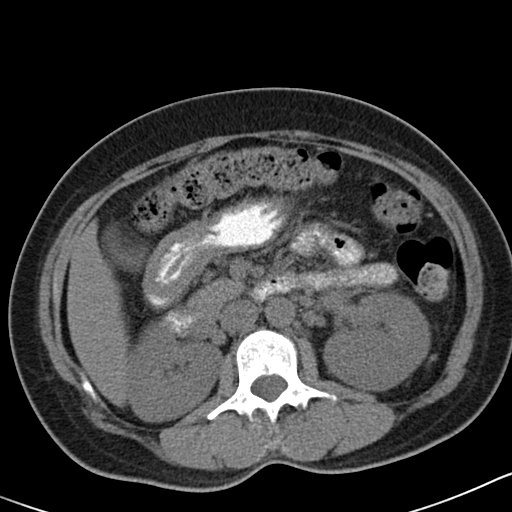
[im 59/88  bone]
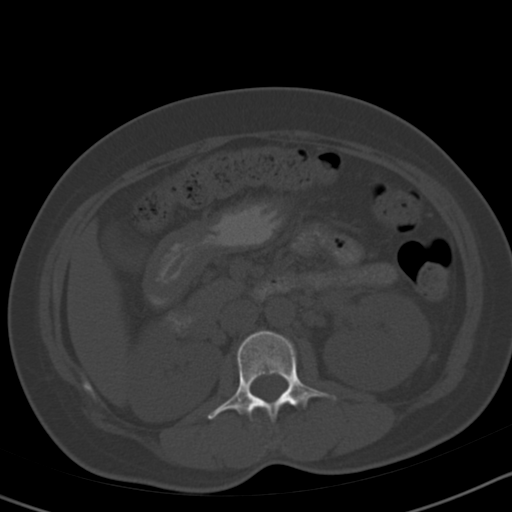
[im 64/88  soft-tissue]
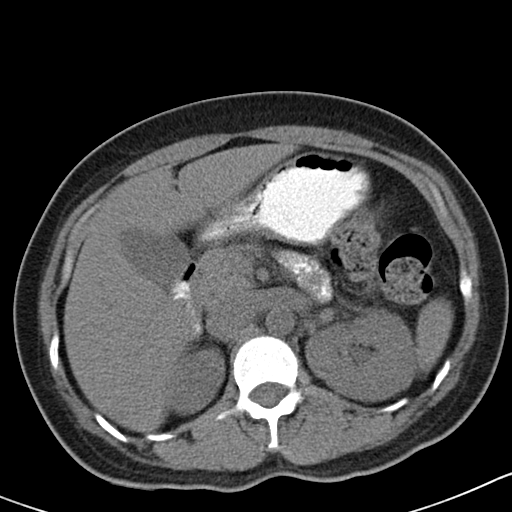
[im 64/88  lung]
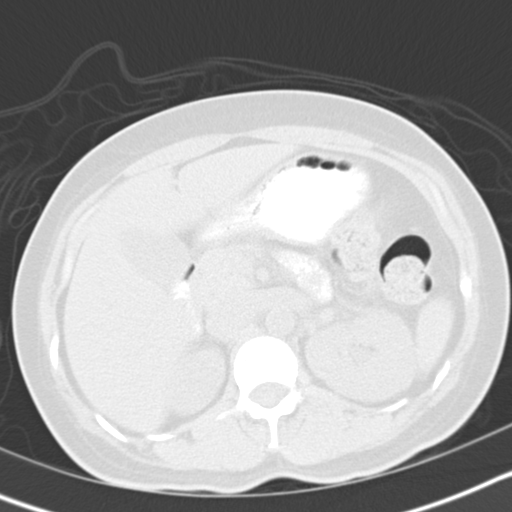
[im 70/88  soft-tissue]
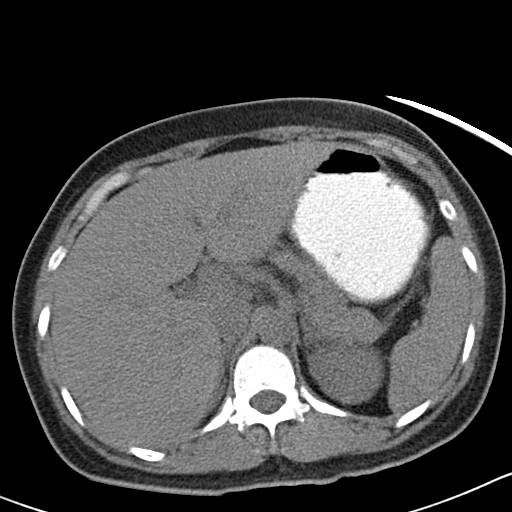
[im 70/88  lung]
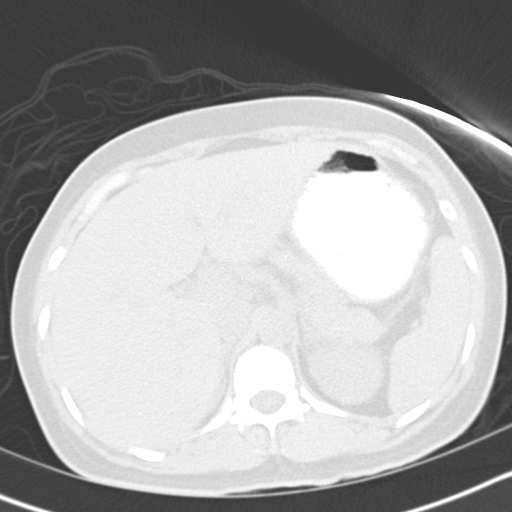
[im 76/88  soft-tissue]
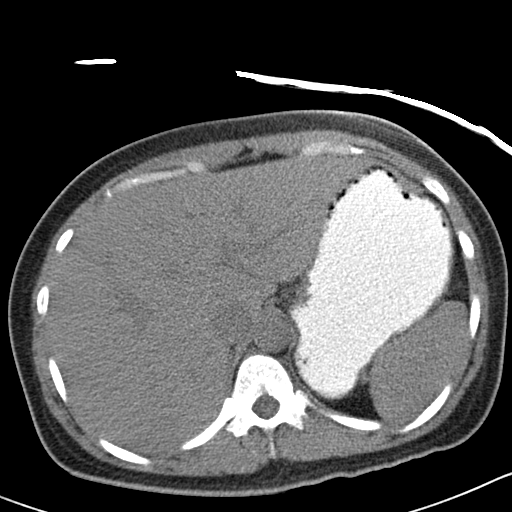
[im 76/88  lung]
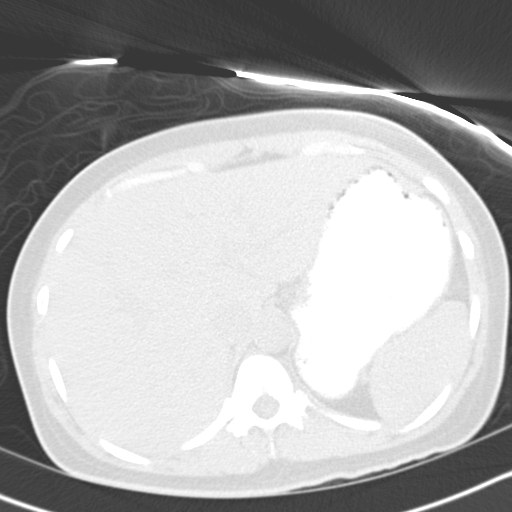
[im 82/88  soft-tissue]
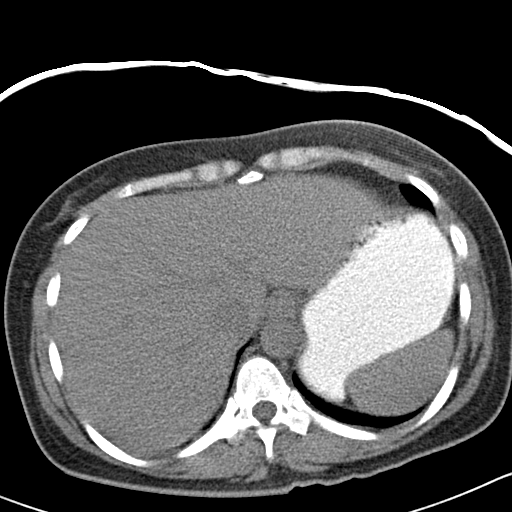
[im 82/88  lung]
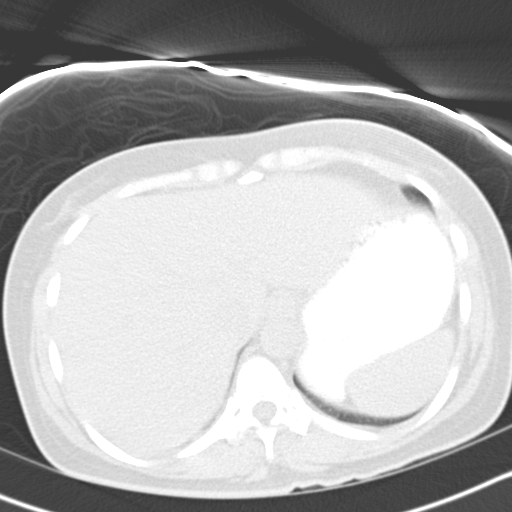

[13 of 32 positions shown; findings below may reference images not displayed]

FINDINGS: Lung bases are clear.

Visualized portions of the liver parenchyma appear normal.

The gallbladder appears normal.  There is no biliary dilatation.

The pancreas is unremarkable.

No focal splenic abnormalities.  The adrenal glands are normal.

Right kidney appears normal.  No right-sided nephrolithiasis or
obstructive uropathy.  There is mild left-sided pelvocaliectasis
and perinephric fat stranding.
No obstructing stone identified.  There is no ureterolithiasis
identified.  Mild left-sided periureteral fat stranding and
hydroureter is noted.

Moderate distention of the urinary bladder.

Periaortic lymph node is identified measuring 9.8 mm, image 34.

No pelvic or inguinal adenopathy.

IUD within the uterine cavity.  Adnexal structures appear normal.

The stomach and small bowel loops are normal.

The appendix is negative.

The colon is unremarkable.

There is no significant free fluid or fluid collections identified.

Review of the visualized osseous structures is unremarkable.
IMPRESSION: 1.  Asymmetric left-sided perinephric fat stranding,
pelvocaliectasis, and hydroureter.  No evidence for obstructing
stone.  In the setting of dysuria, fever and chills findings are
consistent with pyelonephritis.  A recently passed renal stone
could also have a similar CT.
2.  Borderline enlarged periaortic lymph node.  Likely reactive in
etiology.

## 2012-03-05 IMAGING — CT CT HEAD W/O CM
1 series · 16 of 30 positions shown, 20 images · non-contrast
Comparison: 05/08/2007

CLINICAL DATA: Frontal and left parietal headaches for 3 days.
Nausea and hypertension.  Photophobia.

CT HEAD WITHOUT CONTRAST
TECHNIQUE: Contiguous axial images were obtained from the base of
the skull through the vertex without contrast.

[Series 2: head_seq 4.5 h37s st · axial · 0.43mm/px · z∈[-142,-16]mm · 16 of 32 slices shown, 20 images]
[im 2/32  brain]
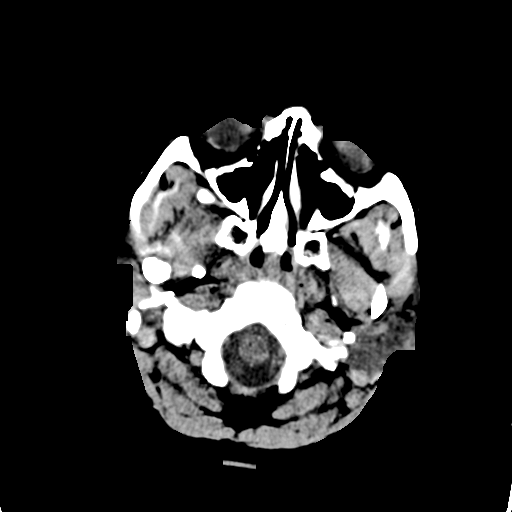
[im 2/32  bone]
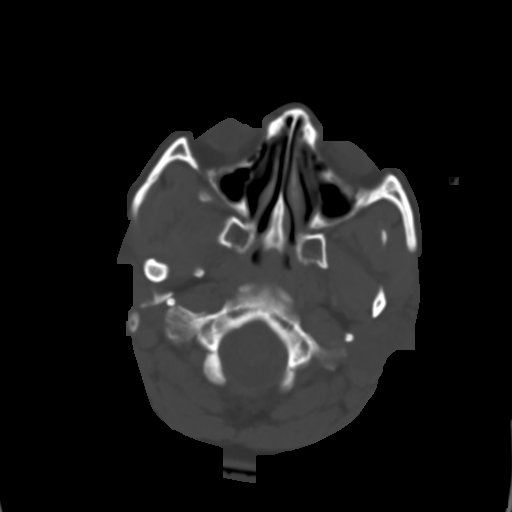
[im 4/32  brain]
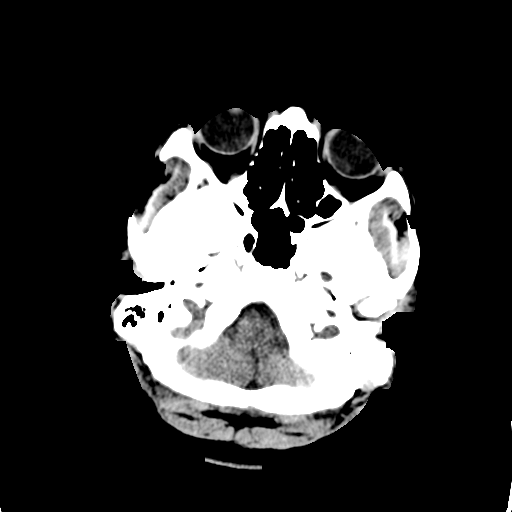
[im 6/32  brain]
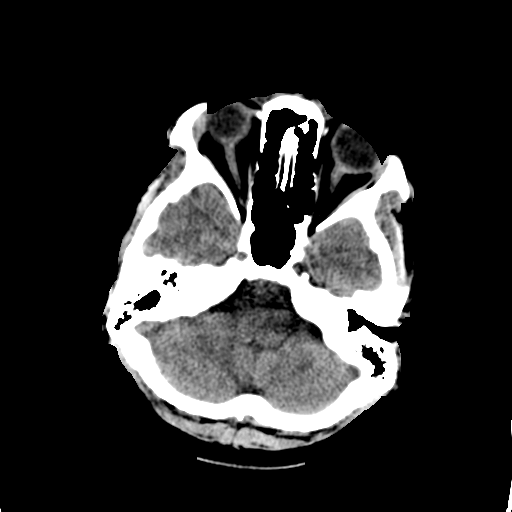
[im 8/32  brain]
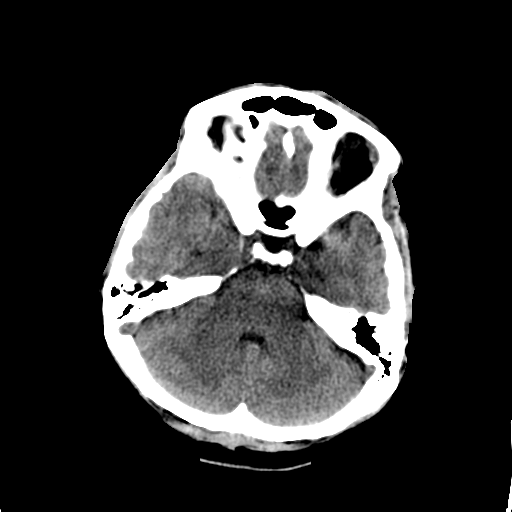
[im 9/32  brain]
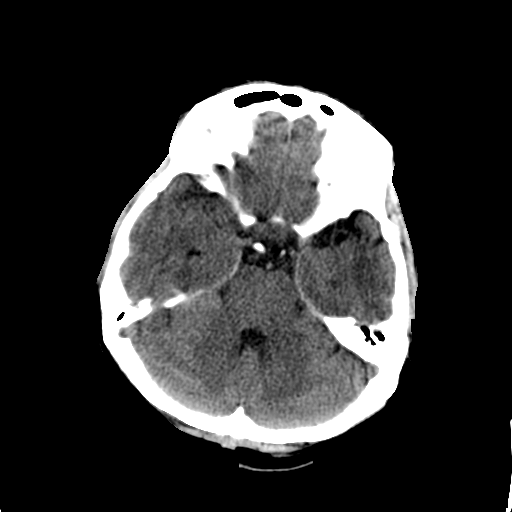
[im 9/32  bone]
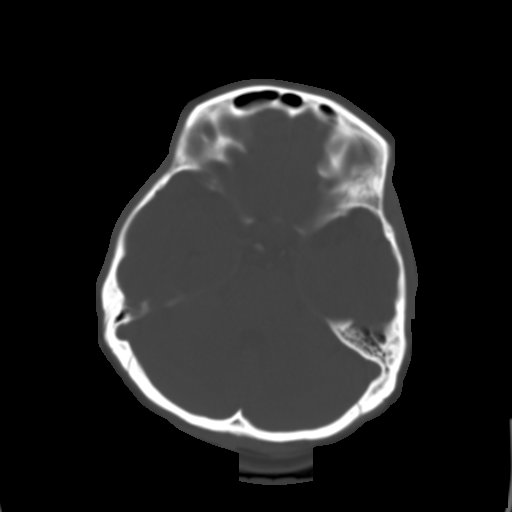
[im 11/32  brain]
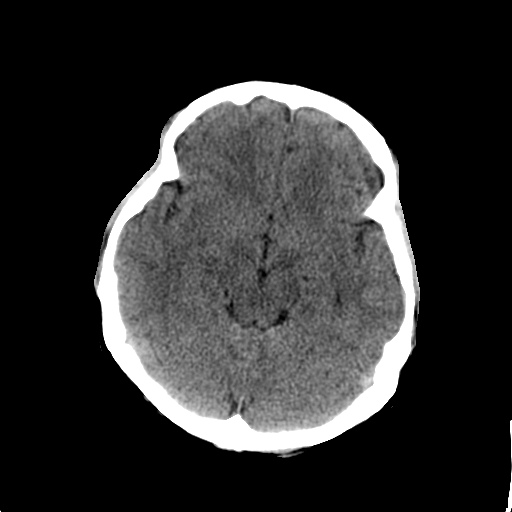
[im 13/32  brain]
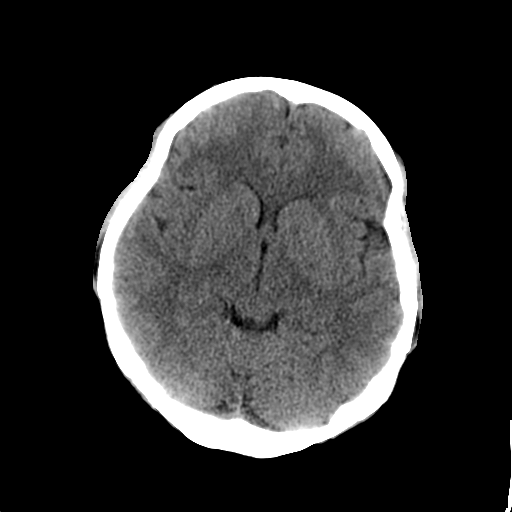
[im 15/32  brain]
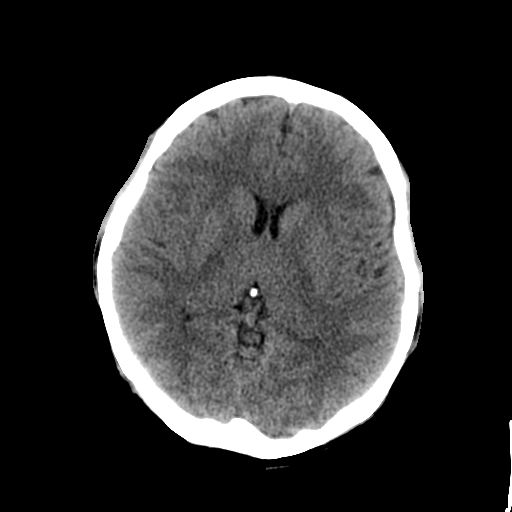
[im 17/32  brain]
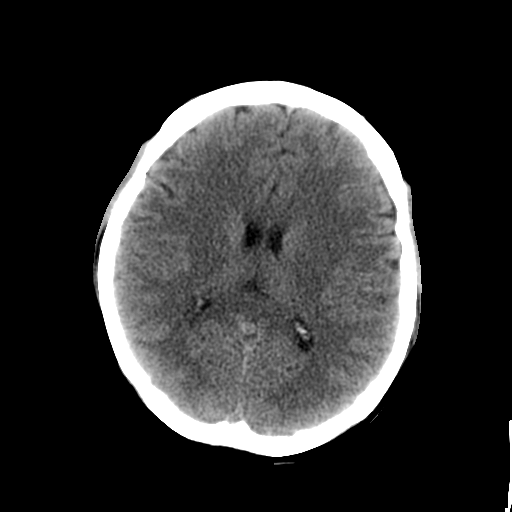
[im 17/32  bone]
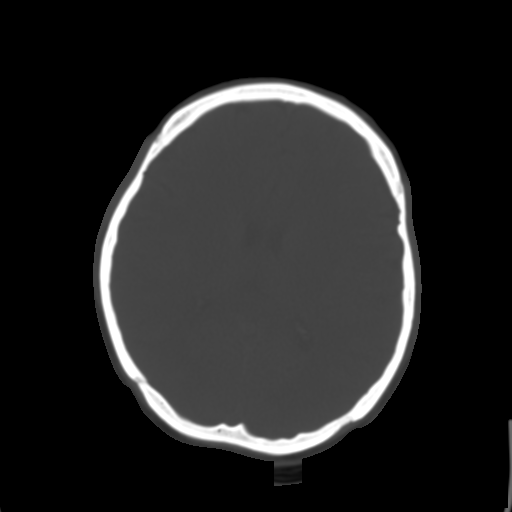
[im 19/32  brain]
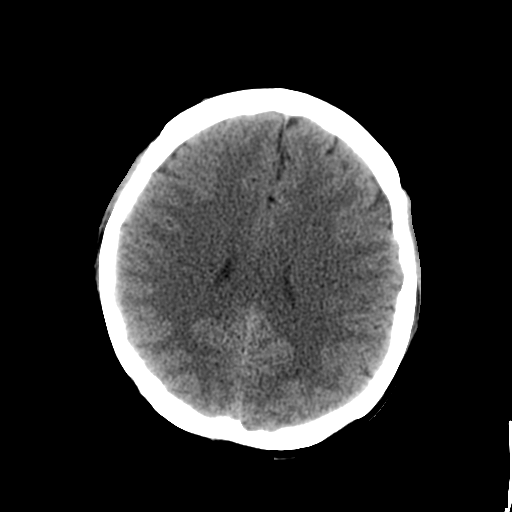
[im 21/32  brain]
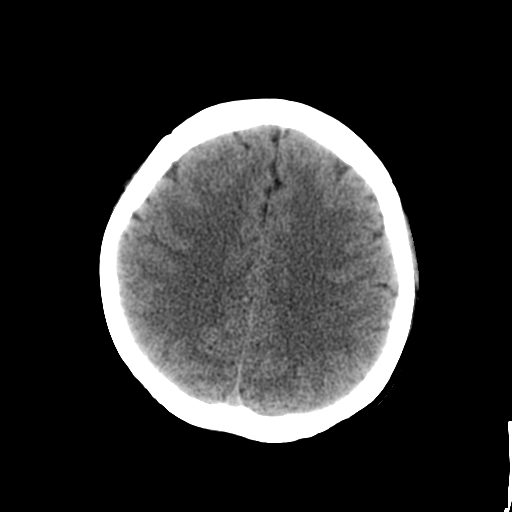
[im 23/32  brain]
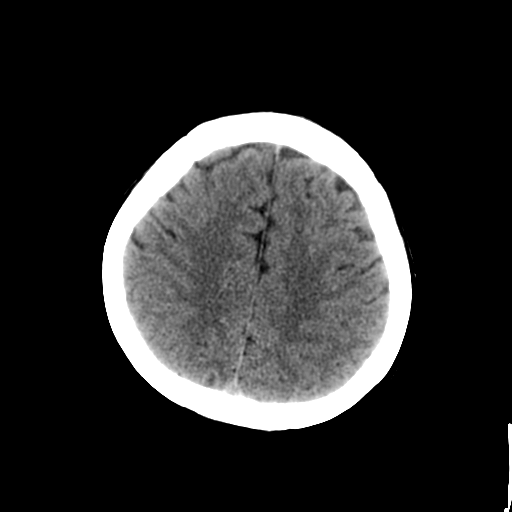
[im 24/32  brain]
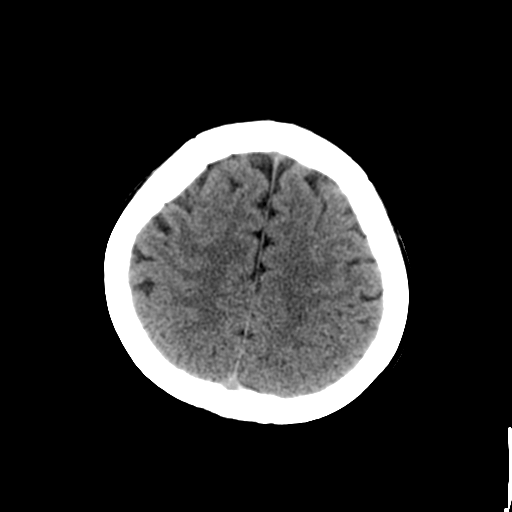
[im 24/32  bone]
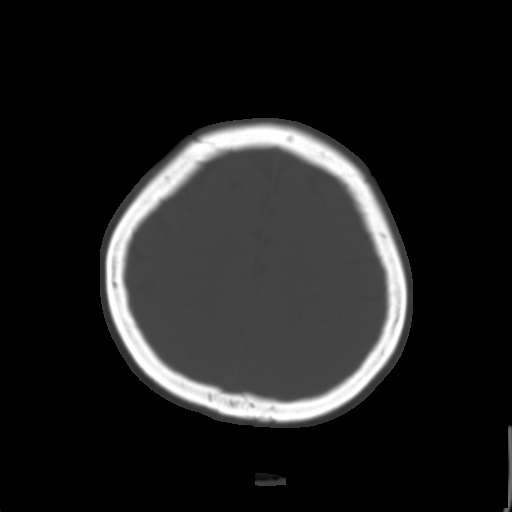
[im 26/32  brain]
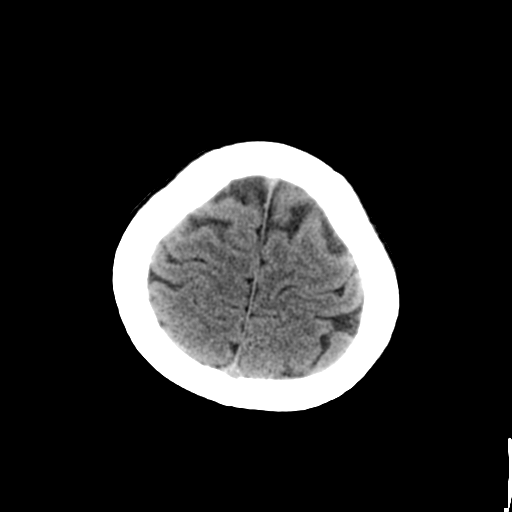
[im 28/32  brain]
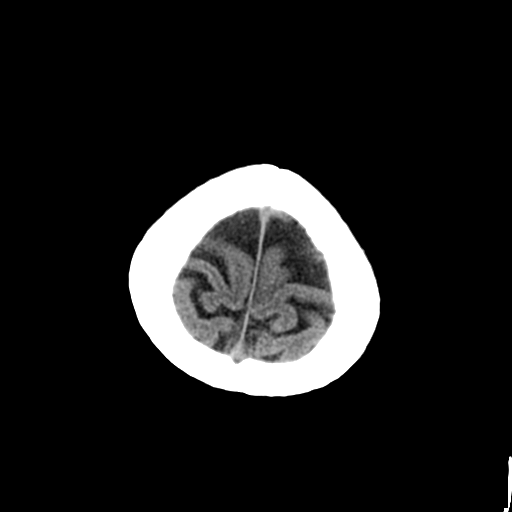
[im 30/32  brain]
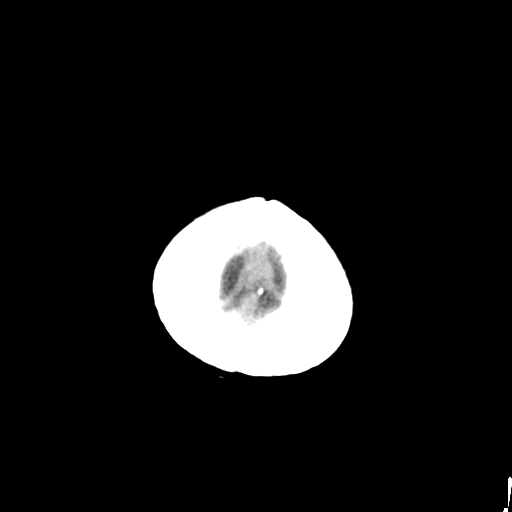

[16 of 30 positions shown; findings below may reference images not displayed]

FINDINGS: The ventricles and sulci are symmetrical without
significant effacement, displacement, or dilatation. No mass effect
or midline shift. No abnormal extra-axial fluid collections. The
grey-white matter junction is distinct. Basal cisterns are not
effaced. No acute intracranial hemorrhage. No depressed skull
fractures.  Visualized paranasal sinuses are not opacified.  No
significant changes since the previous study.
IMPRESSION: No evidence of acute intracranial hemorrhage, acute infarct, or
mass lesion.

## 2013-07-14 ENCOUNTER — Encounter (HOSPITAL_COMMUNITY): Payer: Self-pay | Admitting: Emergency Medicine

## 2013-07-14 ENCOUNTER — Emergency Department (HOSPITAL_COMMUNITY)
Admission: EM | Admit: 2013-07-14 | Discharge: 2013-07-14 | Disposition: A | Discharge status: Home / Self Care | Payer: Medicaid Other | Attending: Emergency Medicine | Admitting: Emergency Medicine

## 2013-07-14 DIAGNOSIS — E876 Hypokalemia: Secondary | ICD-10-CM | POA: Insufficient documentation

## 2013-07-14 DIAGNOSIS — I10 Essential (primary) hypertension: Secondary | ICD-10-CM | POA: Insufficient documentation

## 2013-07-14 DIAGNOSIS — J45909 Unspecified asthma, uncomplicated: Secondary | ICD-10-CM | POA: Insufficient documentation

## 2013-07-14 DIAGNOSIS — R112 Nausea with vomiting, unspecified: Secondary | ICD-10-CM | POA: Insufficient documentation

## 2013-07-14 DIAGNOSIS — N390 Urinary tract infection, site not specified: Secondary | ICD-10-CM | POA: Insufficient documentation

## 2013-07-14 HISTORY — DX: Unspecified asthma, uncomplicated: J45.909

## 2013-07-14 HISTORY — DX: Essential (primary) hypertension: I10

## 2013-07-14 HISTORY — DX: Disorder of kidney and ureter, unspecified: N28.9

## 2013-07-14 LAB — COMPREHENSIVE METABOLIC PANEL
ALT: 61 U/L — AB (ref 0–35)
AST: 52 U/L — AB (ref 0–37)
Albumin: 3.9 g/dL (ref 3.5–5.2)
Alkaline Phosphatase: 93 U/L (ref 39–117)
BUN: 10 mg/dL (ref 6–23)
CALCIUM: 9.7 mg/dL (ref 8.4–10.5)
CHLORIDE: 97 meq/L (ref 96–112)
CO2: 27 meq/L (ref 19–32)
Creatinine, Ser: 0.68 mg/dL (ref 0.50–1.10)
GFR calc non Af Amer: >90 mL/min (ref >90)
Glucose, Bld: 120 mg/dL — ABNORMAL HIGH (ref 70–99)
POTASSIUM: 3.2 meq/L — AB (ref 3.7–5.3)
SODIUM: 138 meq/L (ref 137–147)
Total Bilirubin: 0.9 mg/dL (ref 0.3–1.2)
Total Protein: 8.4 g/dL — ABNORMAL HIGH (ref 6.0–8.3)

## 2013-07-14 LAB — LIPASE, BLOOD: LIPASE: 17 U/L (ref 11–59)

## 2013-07-14 LAB — CBC
HCT: 40.3 % (ref 36.0–46.0)
HEMOGLOBIN: 14.2 g/dL (ref 12.0–15.0)
MCH: 31.8 pg (ref 26.0–34.0)
MCHC: 35.2 g/dL (ref 30.0–36.0)
MCV: 90.4 fL (ref 78.0–100.0)
PLATELETS: 269 10*3/uL (ref 150–400)
RBC: 4.46 MIL/uL (ref 3.87–5.11)
RDW: 12.3 % (ref 11.5–15.5)
WBC: 17.1 10*3/uL — AB (ref 4.0–10.5)

## 2013-07-14 LAB — URINALYSIS, ROUTINE W REFLEX MICROSCOPIC
Glucose, UA: NEGATIVE mg/dL
Ketones, ur: NEGATIVE mg/dL
NITRITE: POSITIVE — AB
PH: 5.5 (ref 5.0–8.0)
Protein, ur: 100 mg/dL — AB
SPECIFIC GRAVITY, URINE: 1.026 (ref 1.005–1.030)
UROBILINOGEN UA: 2 mg/dL — AB (ref 0.0–1.0)

## 2013-07-14 LAB — URINE MICROSCOPIC-ADD ON

## 2013-07-14 MED ORDER — HYDROCODONE-ACETAMINOPHEN 5-325 MG PO TABS: 1.0000 | ORAL_TABLET | ORAL | Status: DC | PRN

## 2013-07-14 MED ORDER — IBUPROFEN 200 MG PO TABS
600.0000 mg | ORAL_TABLET | Freq: Once | ORAL | Status: AC
  Administered 2013-07-14: 600 mg via ORAL
  Filled 2013-07-14: qty 3

## 2013-07-14 MED ORDER — ONDANSETRON HCL 4 MG/2ML IJ SOLN
4.0000 mg | Freq: Once | INTRAMUSCULAR | Status: AC
  Administered 2013-07-14: 4 mg via INTRAVENOUS
  Filled 2013-07-14: qty 2

## 2013-07-14 MED ORDER — DEXTROSE 5 % IV SOLN
1.0000 g | Freq: Once | INTRAVENOUS | Status: AC
  Administered 2013-07-14: 1 g via INTRAVENOUS
  Filled 2013-07-14: qty 10

## 2013-07-14 MED ORDER — SODIUM CHLORIDE 0.9 % IV BOLUS (SEPSIS)
1000.0000 mL | Freq: Once | INTRAVENOUS | Status: AC
  Administered 2013-07-14: 1000 mL via INTRAVENOUS

## 2013-07-14 MED ORDER — CEPHALEXIN 500 MG PO CAPS: 500.0000 mg | ORAL_CAPSULE | Freq: Four times a day (QID) | ORAL | Status: DC

## 2013-07-14 MED ORDER — POTASSIUM CHLORIDE CRYS ER 20 MEQ PO TBCR
40.0000 meq | EXTENDED_RELEASE_TABLET | Freq: Once | ORAL | Status: AC
  Administered 2013-07-14: 40 meq via ORAL
  Filled 2013-07-14: qty 2

## 2013-07-14 MED ORDER — ACETAMINOPHEN 325 MG PO TABS
650.0000 mg | ORAL_TABLET | Freq: Once | ORAL | Status: AC
  Administered 2013-07-14: 650 mg via ORAL
  Filled 2013-07-14: qty 2

## 2013-07-14 NOTE — ED Notes (Addendum)
Per pt, states she aches all over-cold chills then gets hot, vomited once-states painful urination

## 2013-07-14 NOTE — ED Provider Notes (Signed)
CSN: [id number]     Arrival date & time 07/14/13  1602 History   First MD Initiated Contact with Patient 07/14/13 1634     Chief Complaint  Patient presents with  . flu like symptoms      (Consider location/radiation/quality/duration/timing/severity/associated sxs/prior Treatment) HPI Comments: 43 y/o female with a PMHx of HTN, asthma and renal disorder presents to the ED with multiple complaints. Patient states she has had generalized body aches x 3 days with associated subjective fever and chills. States she has a frontal headache described as throbbing, worse when she leans forward. Denies vision change, neck stiffness. She reports she feels very weak and tired, had an episode of emesis earlier today. Admits to suprapubic abdominal pain with associated dysuria, increased urinary frequency and urgency. Denies sick contacts. No recent travel in or out of the country. No contacts with recent travel out of the country. She has tried any alleviating factors for her symptoms.  The history is provided by the patient.    Past Medical History  Diagnosis Date  . Hypertension   . Asthma   . Renal disorder    Past Surgical History  Procedure Laterality Date  . Foot surgery     No family history on file. History  Substance Use Topics  . Smoking status: Never Smoker   . Smokeless tobacco: Not on file  . Alcohol Use: No   OB History   Grav Para Term Preterm Abortions TAB SAB Ect Mult Living                 Review of Systems  Constitutional: Positive for fever, chills and fatigue.  Gastrointestinal: Positive for nausea, vomiting and abdominal pain.  Genitourinary: Positive for dysuria, urgency and frequency.  Musculoskeletal: Positive for arthralgias and myalgias.  Neurological: Positive for weakness and headaches.  All other systems reviewed and are negative.     Allergies  Review of patient's allergies indicates not on file.  Home Medications   Prior to Admission medications    Not on File   There were no vitals taken for this visit. Physical Exam  Nursing note and vitals reviewed. Constitutional: She is oriented to person, place, and time. She appears well-developed and well-nourished. No distress.  HENT:  Head: Normocephalic and atraumatic.  Nose: Mucosal edema present. Right sinus exhibits maxillary sinus tenderness. Left sinus exhibits maxillary sinus tenderness.  Mouth/Throat: Oropharynx is clear and moist.  Moist MM.  Eyes: Conjunctivae are normal.  Neck: Normal range of motion. Neck supple.  No meningeal signs.  Cardiovascular: Normal rate and normal heart sounds.   Tachy ~100.  Pulmonary/Chest: Effort normal and breath sounds normal.  Abdominal: Soft. Normal appearance and bowel sounds are normal. She exhibits no distension. There is tenderness in the suprapubic area. There is no rigidity, no rebound, no guarding and no CVA tenderness.  No peritoneal signs.  Musculoskeletal: Normal range of motion. She exhibits no edema.  Neurological: She is alert and oriented to person, place, and time. She has normal strength. No cranial nerve deficit or sensory deficit. She displays a negative Romberg sign. Coordination and gait normal.  Speech fluent, goal oriented. Moves limbs without ataxia. Equal grip strength bilateral.  Skin: Skin is warm and dry. She is not diaphoretic.  Psychiatric: She has a normal mood and affect. Her behavior is normal.    ED Course  Procedures (including critical care time) Labs Review Labs Reviewed  URINALYSIS, ROUTINE W REFLEX MICROSCOPIC - Abnormal; Notable for the following:  Color, Urine AMBER (*)    APPearance CLOUDY (*)    Hgb urine dipstick LARGE (*)    Bilirubin Urine SMALL (*)    Protein, ur 100 (*)    Urobilinogen, UA 2.0 (*)    Nitrite POSITIVE (*)    Leukocytes, UA MODERATE (*)    All other components within normal limits  CBC - Abnormal; Notable for the following:    WBC 17.1 (*)    All other components  within normal limits  COMPREHENSIVE METABOLIC PANEL - Abnormal; Notable for the following:    Potassium 3.2 (*)    Glucose, Bld 120 (*)    Total Protein 8.4 (*)    AST 52 (*)    ALT 61 (*)    All other components within normal limits  URINE MICROSCOPIC-ADD ON - Abnormal; Notable for the following:    Squamous Epithelial / LPF FEW (*)    Bacteria, UA MANY (*)    All other components within normal limits  URINE CULTURE  LIPASE, BLOOD    Imaging Review No results found.   EKG Interpretation None      MDM   Final diagnoses:  UTI (urinary tract infection)  Hypokalemia   Pt presenting with generalized body aches, weakness, dysuria, headache. She is nontoxic appearing and in no apparent distress. Afebrile. Mild tachycardia, blood pressure 90/77. Abdomen with suprapubic tenderness without peritoneal signs. Maxillary sinus tenderness bilateral, exacerbated by bending forward. No red flags concerning patient's headache. Afebrile, no meningeal signs, no focal neurologic deficits. Plan to get IV and oral hydration, check labs and urine. 5:56 PM Leukocytosis of 17.1. Urine positive for infection, nitrate positive, 21-50 white blood cells, many bacteria and moderate leukocytes. Will continue IV hydration, start rocephin IV, and replace potassium PO. 9:23 PM After receiving fluids, vital signs stable. Heart rate improved. No emesis. Tolerates PO. Patient is stable for discharge, will d/c with keflex and pain meds. Return precautions given. Patient states understanding of treatment care plan and is agreeable. Resources given for PCP f/u.  Trevor MaceRobyn M Albert, PA-C 07/14/13 2125

## 2013-07-14 NOTE — ED Provider Notes (Signed)
Medical screening examination/treatment/procedure(s) were performed by non-physician practitioner and as supervising physician I was immediately available for consultation/collaboration.   EKG Interpretation None        Kristen N Ward, DO 07/14/13 2347 

## 2013-07-14 NOTE — ED Notes (Signed)
PA at bedside.

## 2013-07-14 NOTE — Discharge Instructions (Signed)
Take antibiotic to completion. Stay well hydrated. Take Vicodin for severe pain only. No driving or operating heavy machinery while taking vicodin. This medication may cause drowsiness.  Urinary Tract Infection Urinary tract infections (UTIs) can develop anywhere along your urinary tract. Your urinary tract is your body's drainage system for removing wastes and extra water. Your urinary tract includes two kidneys, two ureters, a bladder, and a urethra. Your kidneys are a pair of bean-shaped organs. Each kidney is about the size of your fist. They are located below your ribs, one on each side of your spine. CAUSES Infections are caused by microbes, which are microscopic organisms, including fungi, viruses, and bacteria. These organisms are so small that they can only be seen through a microscope. Bacteria are the microbes that most commonly cause UTIs. SYMPTOMS  Symptoms of UTIs may vary by age and gender of the patient and by the location of the infection. Symptoms in young women typically include a frequent and intense urge to urinate and a painful, burning feeling in the bladder or urethra during urination. Older women and men are more likely to be tired, shaky, and weak and have muscle aches and abdominal pain. A fever may mean the infection is in your kidneys. Other symptoms of a kidney infection include pain in your back or sides below the ribs, nausea, and vomiting. DIAGNOSIS To diagnose a UTI, your caregiver will ask you about your symptoms. Your caregiver also will ask to provide a urine sample. The urine sample will be tested for bacteria and white blood cells. White blood cells are made by your body to help fight infection. TREATMENT  Typically, UTIs can be treated with medication. Because most UTIs are caused by a bacterial infection, they usually can be treated with the use of antibiotics. The choice of antibiotic and length of treatment depend on your symptoms and the type of bacteria causing  your infection. HOME CARE INSTRUCTIONS  If you were prescribed antibiotics, take them exactly as your caregiver instructs you. Finish the medication even if you feel better after you have only taken some of the medication.  Drink enough water and fluids to keep your urine clear or pale yellow.  Avoid caffeine, tea, and carbonated beverages. They tend to irritate your bladder.  Empty your bladder often. Avoid holding urine for long periods of time.  Empty your bladder before and after sexual intercourse.  After a bowel movement, women should cleanse from front to back. Use each tissue only once. SEEK MEDICAL CARE IF:   You have back pain.  You develop a fever.  Your symptoms do not begin to resolve within 3 days. SEEK IMMEDIATE MEDICAL CARE IF:   You have severe back pain or lower abdominal pain.  You develop chills.  You have nausea or vomiting.  You have continued burning or discomfort with urination. MAKE SURE YOU:   Understand these instructions.  Will watch your condition.  Will get help right away if you are not doing well or get worse. Document Released: 11/15/2004 Document Revised: 08/07/2011 Document Reviewed: 03/16/2011 [facility] Patient Information 2014 [place], [place].

## 2013-07-17 LAB — URINE CULTURE: Colony Count: >=100000

## 2013-07-18 ENCOUNTER — Telehealth (HOSPITAL_BASED_OUTPATIENT_CLINIC_OR_DEPARTMENT_OTHER): Payer: Self-pay | Admitting: Emergency Medicine

## 2013-07-18 NOTE — Telephone Encounter (Signed)
Post ED Visit - Positive Culture Follow-up  Culture report reviewed by antimicrobial stewardship pharmacist: []  [name], Pharm.D., BCPS []  [name], Pharm.D., BCPS [x]  [name], Pharm.D., BCPS []  [name], [place].D., BCPS, AAHIVP []  [name], Pharm.D., BCPS, AAHIVP []  [name], Pharm.D.  Positive urine culture Treated with Keflex, organism sensitive to the same and no further patient follow-up is required at this time.  [name] 07/18/2013, 6:05 PM

## 2014-06-13 ENCOUNTER — Encounter (HOSPITAL_COMMUNITY): Payer: Self-pay | Admitting: Emergency Medicine

## 2014-06-13 ENCOUNTER — Emergency Department (HOSPITAL_COMMUNITY)
Admission: EM | Admit: 2014-06-13 | Discharge: 2014-06-13 | Disposition: A | Discharge status: Home / Self Care | Payer: Medicaid Other | Attending: Emergency Medicine | Admitting: Emergency Medicine

## 2014-06-13 DIAGNOSIS — Z792 Long term (current) use of antibiotics: Secondary | ICD-10-CM | POA: Insufficient documentation

## 2014-06-13 DIAGNOSIS — R2243 Localized swelling, mass and lump, lower limb, bilateral: Secondary | ICD-10-CM | POA: Diagnosis present

## 2014-06-13 DIAGNOSIS — J45909 Unspecified asthma, uncomplicated: Secondary | ICD-10-CM | POA: Insufficient documentation

## 2014-06-13 DIAGNOSIS — I10 Essential (primary) hypertension: Secondary | ICD-10-CM | POA: Diagnosis not present

## 2014-06-13 DIAGNOSIS — R609 Edema, unspecified: Secondary | ICD-10-CM | POA: Insufficient documentation

## 2014-06-13 DIAGNOSIS — Z79899 Other long term (current) drug therapy: Secondary | ICD-10-CM | POA: Insufficient documentation

## 2014-06-13 DIAGNOSIS — Z87448 Personal history of other diseases of urinary system: Secondary | ICD-10-CM | POA: Insufficient documentation

## 2014-06-13 LAB — I-STAT CHEM 8, ED
BUN: 12 mg/dL (ref 6–23)
CREATININE: 0.5 mg/dL (ref 0.50–1.10)
Calcium, Ion: 1.18 mmol/L (ref 1.12–1.23)
Chloride: 104 mmol/L (ref 96–112)
GLUCOSE: 163 mg/dL — AB (ref 70–99)
HCT: 41 % (ref 36.0–46.0)
HEMOGLOBIN: 13.9 g/dL (ref 12.0–15.0)
Potassium: 3.3 mmol/L — ABNORMAL LOW (ref 3.5–5.1)
Sodium: 140 mmol/L (ref 135–145)
TCO2: 20 mmol/L (ref 0–100)

## 2014-06-13 MED ORDER — FUROSEMIDE 20 MG PO TABS: 20.0000 mg | ORAL_TABLET | Freq: Every day | ORAL | Status: DC

## 2014-06-13 MED ORDER — FUROSEMIDE 40 MG PO TABS
20.0000 mg | ORAL_TABLET | Freq: Once | ORAL | Status: AC
  Administered 2014-06-13: 20 mg via ORAL
  Filled 2014-06-13: qty 1

## 2014-06-13 MED ORDER — POTASSIUM CHLORIDE CRYS ER 20 MEQ PO TBCR
40.0000 meq | EXTENDED_RELEASE_TABLET | Freq: Once | ORAL | Status: AC
  Administered 2014-06-13: 40 meq via ORAL
  Filled 2014-06-13: qty 2

## 2014-06-13 MED ORDER — POTASSIUM CHLORIDE ER 10 MEQ PO TBCR: 10.0000 meq | EXTENDED_RELEASE_TABLET | Freq: Every day | ORAL | Status: DC

## 2014-06-13 NOTE — Discharge Instructions (Signed)
Peripheral Edema °You have swelling in your legs (peripheral edema). This swelling is due to excess accumulation of salt and water in your body. Edema may be a sign of heart, kidney or liver disease, or a side effect of a medication. It may also be due to problems in the leg veins. Elevating your legs and using special support stockings may be very helpful, if the cause of the swelling is due to poor venous circulation. Avoid long periods of standing, whatever the cause. °Treatment of edema depends on identifying the cause. Chips, pretzels, pickles and other salty foods should be avoided. Restricting salt in your diet is almost always needed. Water pills (diuretics) are often used to remove the excess salt and water from your body via urine. These medicines prevent the kidney from reabsorbing sodium. This increases urine flow. °Diuretic treatment may also result in lowering of potassium levels in your body. Potassium supplements may be needed if you have to use diuretics daily. Daily weights can help you keep track of your progress in clearing your edema. You should call your caregiver for follow up care as recommended. °SEEK IMMEDIATE MEDICAL CARE IF:  °· You have increased swelling, pain, redness, or heat in your legs. °· You develop shortness of breath, especially when lying down. °· You develop chest or abdominal pain, weakness, or fainting. °· You have a fever. °Document Released: 03/15/2004 Document Revised: 04/30/2011 Document Reviewed: 02/23/2009 °ExitCare® Patient Information ©2015 [employer]. This information is not intended to replace advice given to you by your health care provider. Make sure you discuss any questions you have with your health care provider. ° °

## 2014-06-13 NOTE — ED Notes (Signed)
Pt reports bilateral foot swelling for the past couple of weeks. Pt reports swelling started in her hands last night. Pt states she is unaware why swelling is occurring.

## 2014-06-13 NOTE — ED Provider Notes (Signed)
CSN: [id number]     Arrival date & time 06/13/14  1933 History   First MD Initiated Contact with Patient 06/13/14 2153     Chief Complaint  Patient presents with  . Foot Swelling     (Consider location/radiation/quality/duration/timing/severity/associated sxs/prior Treatment) HPI Comments: Patient complains of bilateral foot swelling times several weeks which is now extended to her hands. Denies any shortness of breath. No CHF type symptoms. Has a history of hypertension and she is on Norvasc. Denies any recent changes to her medications. Symptoms persistent and nothing makes them better worse. No treatment used for this prior to arrival  The history is provided by the patient.    Past Medical History  Diagnosis Date  . Hypertension   . Asthma   . Renal disorder    Past Surgical History  Procedure Laterality Date  . Foot surgery     History reviewed. No pertinent family history. History  Substance Use Topics  . Smoking status: Never Smoker   . Smokeless tobacco: Not on file  . Alcohol Use: No   OB History    No data available     Review of Systems  All other systems reviewed and are negative.     Allergies  Review of patient's allergies indicates no known allergies.  Home Medications   Prior to Admission medications   Medication Sig Start Date End Date Taking? Authorizing Provider  amLODipine (NORVASC) 5 MG tablet Take 5 mg by mouth daily.    Historical Provider, MD  cephALEXin (KEFLEX) 500 MG capsule Take 1 capsule (500 mg total) by mouth 4 (four) times daily. x10 days 07/14/13   Kathrynn Speedobyn M Hess, PA-C  HYDROcodone-acetaminophen (NORCO/VICODIN) 5-325 MG per tablet Take 1-2 tablets by mouth every 4 (four) hours as needed. 07/14/13   Robyn M Hess, PA-C  ibuprofen (ADVIL,MOTRIN) 200 MG tablet Take 400 mg by mouth every 6 (six) hours as needed for moderate pain.     Historical Provider, MD   BP 133/70 mmHg  Temp(Src) 98.1 F (36.7 C) (Oral)  Resp 15  SpO2  99% Physical Exam  Constitutional: She is oriented to person, place, and time. She appears well-developed and well-nourished.  Non-toxic appearance. No distress.  HENT:  Head: Normocephalic and atraumatic.  Eyes: Conjunctivae, EOM and lids are normal. Pupils are equal, round, and reactive to light.  Neck: Normal range of motion. Neck supple. No tracheal deviation present. No thyroid mass present.  Cardiovascular: Normal rate, regular rhythm and normal heart sounds.  Exam reveals no gallop.   No murmur heard. Pulmonary/Chest: Effort normal and breath sounds normal. No stridor. No respiratory distress. She has no decreased breath sounds. She has no wheezes. She has no rhonchi. She has no rales.  Abdominal: Soft. Normal appearance and bowel sounds are normal. She exhibits no distension. There is no tenderness. There is no rebound and no CVA tenderness.  Musculoskeletal: Normal range of motion. She exhibits no edema or tenderness.  Bilateral 2+ pitting edema noted in both lower extremities. Slight bilateral hand swelling noted  Neurological: She is alert and oriented to person, place, and time. She has normal strength. No cranial nerve deficit or sensory deficit. GCS eye subscore is 4. GCS verbal subscore is 5. GCS motor subscore is 6.  Skin: Skin is warm and dry. No abrasion and no rash noted.  Psychiatric: She has a normal mood and affect. Her speech is normal and behavior is normal.  Nursing note and vitals reviewed.   ED Course  Procedures (including critical care time) Labs Review Labs Reviewed  I-STAT CHEM 8, ED    Imaging Review No results found.   EKG Interpretation None      MDM   Final diagnoses:  None    Patient's edema likely form norvasc, will place on diuretic for 3 days and oral K+ and pt will f/u her pcp   Lorre Nick, MD 06/13/14 2311

## 2018-08-18 ENCOUNTER — Ambulatory Visit: Payer: Self-pay | Admitting: Physical Therapy

## 2018-08-20 ENCOUNTER — Ambulatory Visit: Payer: Medicaid Other | Attending: Family Medicine | Admitting: Physical Therapy

## 2018-08-20 ENCOUNTER — Encounter: Payer: Self-pay | Admitting: Physical Therapy

## 2018-08-20 ENCOUNTER — Other Ambulatory Visit: Payer: Self-pay

## 2018-08-20 DIAGNOSIS — M25521 Pain in right elbow: Secondary | ICD-10-CM | POA: Diagnosis present

## 2018-08-25 ENCOUNTER — Ambulatory Visit: Payer: Self-pay | Admitting: Physical Therapy

## 2018-08-28 ENCOUNTER — Other Ambulatory Visit: Payer: Self-pay

## 2018-08-28 ENCOUNTER — Ambulatory Visit: Payer: Medicaid Other | Admitting: Physical Therapy

## 2018-08-28 DIAGNOSIS — M25521 Pain in right elbow: Secondary | ICD-10-CM

## 2018-09-04 ENCOUNTER — Ambulatory Visit: Payer: Medicaid Other | Admitting: Physical Therapy

## 2018-09-05 ENCOUNTER — Ambulatory Visit: Payer: Medicaid Other | Admitting: Physical Therapy

## 2018-09-05 ENCOUNTER — Other Ambulatory Visit: Payer: Self-pay

## 2018-09-05 DIAGNOSIS — M25521 Pain in right elbow: Secondary | ICD-10-CM

## 2018-09-11 ENCOUNTER — Ambulatory Visit: Payer: Medicaid Other | Admitting: Physical Therapy

## 2018-09-12 ENCOUNTER — Ambulatory Visit: Payer: Medicaid Other | Admitting: Physical Therapy

## 2018-09-12 ENCOUNTER — Other Ambulatory Visit: Payer: Self-pay | Admitting: Family Medicine

## 2018-09-12 ENCOUNTER — Other Ambulatory Visit: Payer: Self-pay

## 2018-09-12 DIAGNOSIS — Z1231 Encounter for screening mammogram for malignant neoplasm of breast: Secondary | ICD-10-CM

## 2018-09-12 DIAGNOSIS — M25521 Pain in right elbow: Secondary | ICD-10-CM

## 2018-09-29 ENCOUNTER — Ambulatory Visit: Payer: Medicaid Other | Admitting: Physical Therapy

## 2018-09-29 ENCOUNTER — Encounter: Payer: Self-pay | Admitting: *Deleted

## 2018-10-01 ENCOUNTER — Telehealth: Payer: Self-pay | Admitting: Obstetrics and Gynecology

## 2018-10-01 ENCOUNTER — Ambulatory Visit: Payer: Medicaid Other | Attending: Family Medicine | Admitting: Physical Therapy

## 2018-10-01 ENCOUNTER — Other Ambulatory Visit: Payer: Self-pay

## 2018-10-01 DIAGNOSIS — M25521 Pain in right elbow: Secondary | ICD-10-CM | POA: Diagnosis present

## 2018-10-01 NOTE — Telephone Encounter (Signed)
Spoke with patient about her appointment on 8/13 @ 8:55. Patient stated that she did not know about this appointment and would like to be rescheduled. Patient was rescheduled to 8/26 @ 2:35

## 2018-10-02 ENCOUNTER — Encounter: Payer: Self-pay | Admitting: Obstetrics and Gynecology

## 2018-10-06 ENCOUNTER — Encounter: Payer: Self-pay | Admitting: Physical Therapy

## 2018-10-06 ENCOUNTER — Other Ambulatory Visit: Payer: Self-pay

## 2018-10-06 ENCOUNTER — Ambulatory Visit: Payer: Medicaid Other | Admitting: Physical Therapy

## 2018-10-06 DIAGNOSIS — M25521 Pain in right elbow: Secondary | ICD-10-CM

## 2018-10-06 NOTE — Therapy (Signed)
[place], [place], [address] Phone: [phone]   Fax:  [phone]  Physical Therapy Treatment  Patient Details  Name: [name] MRN: [id number] Date of Birth: [birth year] Referring Provider (PT): Rip Harbour   Encounter Date: 10/06/2018  PT End of Session - 10/06/18 1635    Visit Number  6    Date for PT Re-Evaluation  11/24/18    Authorization Type  Medicaid    PT Start Time  1600    PT Stop Time  1653    PT Time Calculation (min)  53 min    Activity Tolerance  Patient tolerated treatment well    Behavior During Therapy  [facility] for tasks assessed/performed       Past Medical History:  Diagnosis Date  . Diabetes mellitus without complication ([place])   . Hypertension     History reviewed. No pertinent surgical history.  There were no vitals filed for this visit.  Subjective Assessment - 10/06/18 1606    Subjective  reports pain on R arm.    Pain Score  7     Pain Location  Elbow    Pain Orientation  Right;Lateral                       [facility] Adult PT Treatment/Exercise - 10/06/18 0001      Electrical Stimulation   Electrical Stimulation Location  right elbow and forearm    Electrical Stimulation Action  premod    Electrical Stimulation Parameters  sitting    Electrical Stimulation Goals  Pain      Ultrasound   Ultrasound Location  Rt lateral elbow    Ultrasound Parameters  8 min    Ultrasound Goals  Pain      Iontophoresis   Type of Iontophoresis  Dexamethasone    Location  Rt lateral elbow    Dose  1 ml    Time  4 hour patch                  PT Long Term Goals - 09/12/18 0950      PT LONG TERM GOAL #1   Title  independent with HEP    Baseline  stretches and STW but held on ex    Status  Partially Met      PT LONG TERM GOAL #2   Title  understand posture and body mechanics for her dx    Status  Partially Met      PT LONG TERM GOAL #3   Title  decrease pain 50%    Baseline  had met until cooked and pain returned    Status  Partially Met      PT LONG TERM GOAL #4   Title  increase right grip strength to equal that of the left    Baseline  RT 1/2 of Left    Status  On-going            Plan - 10/06/18 1639    Clinical Impression Statement  Pt reports some improvement in her distal forearm. She continues to report pain in her R lateral epicondyle with her ADL. Continues with modality to help with pain.    Stability/Clinical Decision Making  Evolving/Moderate complexity    Rehab Potential  Good    PT Frequency  1x / week    PT Duration  8 weeks    PT Treatment/Interventions  ADLs/Self Care Home Management;Cryotherapy;Dealer  Stimulation;Iontophoresis 74m/ml Dexamethasone;Moist Heat;Ultrasound;Therapeutic exercise;Therapeutic activities;Patient/family education;Manual techniques;Dry needling    PT Next Visit Plan  Assess DN. Continue with gentle isometrics if tolerated.       Patient will benefit from skilled therapeutic intervention in order to improve the following deficits and impairments:  Improper body mechanics, Pain, Increased muscle spasms, Decreased range of motion, Decreased strength, Impaired UE functional use, Impaired flexibility  Visit Diagnosis: 1. Pain in right elbow        Problem List There are no active problems to display for this patient.   RScot Jun PTA 10/06/2018, 4:40 PM  [place] [address]Phone: [phone]  Fax:  [phone] Name: Virginia MarquartMRN: [id number] of Birth: 502-19-1972

## 2018-10-08 ENCOUNTER — Ambulatory Visit: Payer: Medicaid Other

## 2018-10-08 ENCOUNTER — Ambulatory Visit: Payer: Medicaid Other | Admitting: Physical Therapy

## 2018-10-09 ENCOUNTER — Ambulatory Visit: Payer: Medicaid Other | Admitting: Physical Therapy

## 2018-10-09 ENCOUNTER — Other Ambulatory Visit: Payer: Self-pay

## 2018-10-09 ENCOUNTER — Encounter: Payer: Self-pay | Admitting: Physical Therapy

## 2018-10-09 DIAGNOSIS — M25521 Pain in right elbow: Secondary | ICD-10-CM | POA: Diagnosis not present

## 2018-10-14 ENCOUNTER — Telehealth: Payer: Self-pay | Admitting: Medical

## 2018-10-14 NOTE — Telephone Encounter (Signed)
Spoke with patient about her appointment for 09/26. She stated she was having some symptoms and wanted to know where she could go for a free test. I informed her we would need to reschedule her office visit with [place], and gave her [facility] for testing.

## 2018-10-15 ENCOUNTER — Other Ambulatory Visit: Payer: Self-pay

## 2018-10-15 ENCOUNTER — Encounter: Payer: Self-pay | Admitting: Medical

## 2018-10-15 ENCOUNTER — Ambulatory Visit: Payer: Medicaid Other | Admitting: Physical Therapy

## 2018-10-15 DIAGNOSIS — M25521 Pain in right elbow: Secondary | ICD-10-CM

## 2018-10-16 ENCOUNTER — Other Ambulatory Visit: Payer: Self-pay

## 2018-10-16 DIAGNOSIS — Z20822 Contact with and (suspected) exposure to covid-19: Secondary | ICD-10-CM

## 2018-10-18 LAB — NOVEL CORONAVIRUS, NAA: SARS-CoV-2, NAA: NOT DETECTED

## 2018-10-22 ENCOUNTER — Ambulatory Visit: Payer: Medicaid Other | Attending: Family Medicine | Admitting: Physical Therapy

## 2018-10-22 ENCOUNTER — Other Ambulatory Visit: Payer: Self-pay

## 2018-10-22 DIAGNOSIS — M25521 Pain in right elbow: Secondary | ICD-10-CM | POA: Insufficient documentation

## 2018-10-30 ENCOUNTER — Ambulatory Visit: Payer: Medicaid Other | Admitting: Physical Therapy

## 2018-10-30 ENCOUNTER — Other Ambulatory Visit: Payer: Self-pay

## 2018-10-30 VITALS — HR 90

## 2018-10-30 DIAGNOSIS — M25521 Pain in right elbow: Secondary | ICD-10-CM

## 2018-10-30 NOTE — Therapy (Signed)
[facility] [address] [place], [place], [address] Phone: [phone]   Fax:  [phone]  Physical Therapy Treatment  Patient Details  Name: [name] MRN: [id number] Date of Birth: 02/15/1971 Referring Provider (PT): [place]   Encounter Date: 10/30/2018  PT End of Session - 10/30/18 1400    Visit Number  10    Number of Visits  12    Date for PT Re-Evaluation  11/24/18    Authorization Type  Medicaid    PT Start Time  1400    PT Stop Time  1434    PT Time Calculation (min)  34 min    Activity Tolerance  Patient tolerated treatment well    Behavior During Therapy  WFL for tasks assessed/performed       Past Medical History:  Diagnosis Date  . Diabetes mellitus without complication (HCC)   . Hypertension     History reviewed. No pertinent surgical history.  Vitals:   10/30/18 1416  Pulse: 90    Subjective Assessment - 10/30/18 1402    Subjective  Pt 15 min late. She was out of town this past weekend and didn't do anything with her arm, but pain is worse now. Tape is still helping and she wears brace    Currently in Pain?  Yes    Pain Score  8     Pain Location  Elbow    Pain Orientation  Right    Pain Descriptors / Indicators  Aching    Pain Type  Acute pain                       [facility] Adult PT Treatment/Exercise - 10/30/18 0001      Self-Care   Self-Care  Other Self-Care Comments    Other Self-Care Comments   discussed POC and what she should be doing at home; discussed brachial pulse and need for MD f/u for this and also for elbow pain      Manual Therapy   Manual Therapy  Soft tissue mobilization    Soft tissue mobilization  to right UT, infraspinatus and ECRB       Trigger Point Dry Needling - 10/30/18 0001    Consent Given?  Yes    Muscles Treated Head and Neck  Upper trapezius    Muscles Treated Upper Quadrant  Infraspinatus    Muscles Treated Wrist/Hand  Extensor  carpi radialis longus/brevis    Upper Trapezius Response  Twitch reponse elicited;Palpable increased muscle length    Infraspinatus Response  Twitch response elicited;Palpable increased muscle length    Extensor carpi radialis longus/brevis Response  Palpable increased muscle length                PT Long Term Goals - 10/30/18 1537      PT LONG TERM GOAL #1   Title  independent with HEP    Time  8    Period  Weeks    Status  Achieved      PT LONG TERM GOAL #2   Title  understand posture and body mechanics for her dx    Time  8    Period  Weeks    Status  Achieved      PT LONG TERM GOAL #3   Title  decrease pain 50%    Time  8    Period  Weeks    Status  Not Met        PT LONG TERM GOAL #4   Title  increase right grip strength to equal that of the left    Baseline  RT 1/2 of Left    Time  8    Period  Weeks    Status  Not Met            Plan - 10/30/18 1530    Clinical Impression Statement  Patient presents today with no improvement in pin point pain at right lateral epicondyle. Her muscle tension in her wrist extensors has improved considerably and she reports improvement with function overall as long as she is wearing a brace and/or tape. She continues to massage and ice regularly. The DN and iontophoresis helped with pain intially and reduced it to the pointed pain at elbow, but no further reduction has occurred, so pt is being referred back to MD. Additionally, at this visit patient showed PT visible pulsing at brachial pulse in right arm. The artery appeared distended. Pulse was recorded at 90 bpm. It was recommended that she f/u with primary MD right away.    PT Treatment/Interventions  ADLs/Self Care Home Management;Cryotherapy;Electrical Stimulation;Iontophoresis 4mg/ml Dexamethasone;Moist Heat;Ultrasound;Therapeutic exercise;Therapeutic activities;Patient/family education;Manual techniques;Dry needling    PT Next Visit Plan  on hold until sees MD     Consulted and Agree with Plan of Care  Patient       Patient will benefit from skilled therapeutic intervention in order to improve the following deficits and impairments:  Improper body mechanics, Pain, Increased muscle spasms, Decreased range of motion, Decreased strength, Impaired UE functional use, Impaired flexibility  Visit Diagnosis: Pain in right elbow     Problem List There are no active problems to display for this patient.   Julie Riddles PT 10/30/2018, 3:42 PM  [facility] [address] [place], [place], [address] Phone: [phone]   Fax:  [phone]  Name: [name] MRN: [id number] Date of Birth: 03/20/1970   

## 2018-11-05 ENCOUNTER — Ambulatory Visit: Payer: Medicaid Other

## 2018-11-12 ENCOUNTER — Encounter: Payer: Self-pay | Admitting: Medical

## 2018-11-20 DIAGNOSIS — Z8616 Personal history of COVID-19: Secondary | ICD-10-CM

## 2018-11-20 HISTORY — DX: Personal history of COVID-19: Z86.16

## 2018-12-15 ENCOUNTER — Other Ambulatory Visit: Payer: Self-pay

## 2018-12-15 DIAGNOSIS — Z20822 Contact with and (suspected) exposure to covid-19: Secondary | ICD-10-CM

## 2018-12-16 LAB — NOVEL CORONAVIRUS, NAA: SARS-CoV-2, NAA: DETECTED — AB

## 2018-12-31 ENCOUNTER — Other Ambulatory Visit: Payer: Self-pay

## 2018-12-31 DIAGNOSIS — Z20822 Contact with and (suspected) exposure to covid-19: Secondary | ICD-10-CM

## 2019-01-02 ENCOUNTER — Ambulatory Visit
Admission: RE | Admit: 2019-01-02 | Discharge: 2019-01-02 | Disposition: A | Discharge status: Home / Self Care | Payer: Medicaid Other | Source: Ambulatory Visit | Attending: Family Medicine | Admitting: Family Medicine

## 2019-01-02 ENCOUNTER — Other Ambulatory Visit: Payer: Self-pay

## 2019-01-02 ENCOUNTER — Ambulatory Visit: Payer: Medicaid Other

## 2019-01-02 DIAGNOSIS — Z1231 Encounter for screening mammogram for malignant neoplasm of breast: Secondary | ICD-10-CM

## 2019-01-02 LAB — NOVEL CORONAVIRUS, NAA: SARS-CoV-2, NAA: NOT DETECTED

## 2019-01-06 ENCOUNTER — Other Ambulatory Visit: Payer: Self-pay | Admitting: Family Medicine

## 2019-01-06 DIAGNOSIS — R928 Other abnormal and inconclusive findings on diagnostic imaging of breast: Secondary | ICD-10-CM

## 2019-01-13 ENCOUNTER — Other Ambulatory Visit: Payer: Self-pay

## 2019-01-13 DIAGNOSIS — K624 Stenosis of anus and rectum: Secondary | ICD-10-CM

## 2019-01-14 LAB — NOVEL CORONAVIRUS, NAA: SARS-CoV-2, NAA: NOT DETECTED

## 2019-01-21 ENCOUNTER — Ambulatory Visit
Admission: RE | Admit: 2019-01-21 | Discharge: 2019-01-21 | Disposition: A | Discharge status: Home / Self Care | Payer: Medicaid Other | Source: Ambulatory Visit | Attending: Family Medicine | Admitting: Family Medicine

## 2019-01-21 ENCOUNTER — Other Ambulatory Visit: Payer: Self-pay

## 2019-01-21 DIAGNOSIS — R928 Other abnormal and inconclusive findings on diagnostic imaging of breast: Secondary | ICD-10-CM

## 2019-01-30 ENCOUNTER — Other Ambulatory Visit: Payer: Self-pay

## 2019-01-30 DIAGNOSIS — Z20822 Contact with and (suspected) exposure to covid-19: Secondary | ICD-10-CM

## 2019-02-01 LAB — NOVEL CORONAVIRUS, NAA: SARS-CoV-2, NAA: NOT DETECTED

## 2019-12-31 ENCOUNTER — Encounter: Payer: Self-pay | Admitting: Genetic Counselor

## 2019-12-31 ENCOUNTER — Telehealth: Payer: Self-pay | Admitting: Genetic Counselor

## 2019-12-31 NOTE — Telephone Encounter (Signed)
Received a genetic counseling referral from [facility] for fhx of cancer. Ms. Meikle has been cld and scheduled to see Cari on 12/2 at 2pm. Letter mailed.

## 2020-01-21 ENCOUNTER — Other Ambulatory Visit: Payer: Self-pay

## 2020-01-21 ENCOUNTER — Inpatient Hospital Stay: Payer: Medicaid Other | Attending: Genetic Counselor | Admitting: Genetic Counselor

## 2020-01-21 ENCOUNTER — Inpatient Hospital Stay: Payer: Medicaid Other

## 2020-01-21 DIAGNOSIS — Z801 Family history of malignant neoplasm of trachea, bronchus and lung: Secondary | ICD-10-CM

## 2020-01-21 DIAGNOSIS — Z8051 Family history of malignant neoplasm of kidney: Secondary | ICD-10-CM | POA: Diagnosis not present

## 2020-01-21 DIAGNOSIS — Z800 Family history of malignant neoplasm of digestive organs: Secondary | ICD-10-CM | POA: Diagnosis not present

## 2020-01-22 ENCOUNTER — Encounter: Payer: Self-pay | Admitting: Genetic Counselor

## 2020-01-22 NOTE — Progress Notes (Signed)
[place]: [facility] [place],  [place] [address]  PRIMARY PROVIDER:  Kristie Cowman, MD  PRIMARY REASON FOR VISIT:  1. Family history of malignant neoplasm of digestive organ   2. Family history of kidney cancer   3. Family history of lung cancer    HISTORY OF PRESENT ILLNESS:   Ms. [name], a 49 y.o. female, was seen for a [facility] cancer genetics consultation due to a family history of cancer.  Ms. [name] presents to clinic today to discuss the possibility of a hereditary predisposition to cancer, to discuss genetic testing, and to further clarify her future cancer risks, as well as potential cancer risks for family members.    Ms. [name] is a 49 y.o. female with no personal history of cancer.    RISK FACTORS:  Menarche was at age 53.  First live birth at age 56.  OCP use for approximately 2 years.  Ovaries intact: yes.  Hysterectomy: no.  Menopausal status: perimenopausal.  HRT use: 0 years. Colonoscopy: yes; within past 1 year per patient. Mammogram within the last year: yes. Number of breast biopsies: 0. Any excessive radiation exposure in the past: no  Past Medical History:  Diagnosis Date  . Asthma   . Hypertension   . Renal disorder     Past Surgical History:  Procedure Laterality Date  . FOOT SURGERY      Social History   Socioeconomic History  . Marital status: Married    Spouse name: Not on file  . Number of children: Not on file  . Years of education: Not on file  . Highest education level: Not on file  Occupational History  . Not on file  Tobacco Use  . Smoking status: Never Smoker  Substance and Sexual Activity  . Alcohol use: No  . Drug use: No  . Sexual activity: Not on file  Other Topics Concern  . Not on file  Social History Narrative  . Not on file   Social Determinants of Health   Financial Resource Strain:   . Difficulty of Paying Living Expenses: Not on file  Food Insecurity:   . Worried About  Charity fundraiser in the Last Year: Not on file  . Ran Out of Food in the Last Year: Not on file  Transportation Needs:   . Lack of Transportation (Medical): Not on file  . Lack of Transportation (Non-Medical): Not on file  Physical Activity:   . Days of Exercise per Week: Not on file  . Minutes of Exercise per Session: Not on file  Stress:   . Feeling of Stress : Not on file  Social Connections:   . Frequency of Communication with Friends and Family: Not on file  . Frequency of Social Gatherings with Friends and Family: Not on file  . Attends Religious Services: Not on file  . Active Member of Clubs or Organizations: Not on file  . Attends Archivist Meetings: Not on file  . Marital Status: Not on file     FAMILY HISTORY:  We obtained a detailed, 4-generation family history.  Significant diagnoses are listed below: Family History  Problem Relation Age of Onset  . Lung cancer Mother        unknown age of onset  . Kidney cancer Mother 10  . Pancreatic cancer Sister        dx 71s  . Colon cancer Sister        dx 9s  .  Lung cancer Maternal Grandfather        d. 72s          Ms. [name] has five sisters, one of whom is reported to have a history of pancreatic and/or colon cancer diagnosed in her 53s.  She did not have genetic testing.  Ms. [name] mother had kidney cancer at age 81 and lung cancer at an unknown age.  Ms. [name] mother passed away at age 19.  Ms. [name] maternal grandfather had a history of lung cancer and passed away in his 40s.  No other maternal family history of cancer was reported.  Ms. [name] had limited information about her father and did not reported any known paternal family history.   Ms. [name] is unaware of previous family history of genetic testing for hereditary cancer risks. Patient's maternal ancestors are of [place] descent, and paternal ancestors are of [place] descent. There is no reported Ashkenazi Jewish ancestry. There is no  known consanguinity.  GENETIC COUNSELING ASSESSMENT: Ms. Deeg is a 49 y.o. female with a family history of cancer which is somewhat suggestive of a hereditary cancer syndrome and predisposition to cancer. We, therefore, discussed and recommended the following at today's visit.   DISCUSSION: We discussed that 5 - 10% of cancer is hereditary, with most cases of hereditary pancreatic cancer associated with mutations in BRCA1/2.  Most cases of hereditary colon cancer syndromes are associated with mutations in the Lynch syndrome genes. There are other genes that can be associated with hereditary pancreatic, colon, and kidney cancer syndromes.  Type of cancer risk and level of risk are gene-specific.  We discussed that testing is beneficial for several reasons, including knowing about other cancer risks, identifying potential screening and risk-reduction options that may be appropriate, and to understanding if other family members could be at risk for cancer and allowing them to undergo genetic testing.  We reviewed the characteristics, features and inheritance patterns of hereditary cancer syndromes. We also discussed genetic testing, including the appropriate family members to test, the process of testing, insurance coverage and turn-around-time for results. We discussed the implications of a negative, positive, carrier and/or variant of uncertain significant result. We discussed that negative results would be uninformative given that Ms. [name] does not have a personal history of cancer. We recommended Ms. [name] pursue genetic testing for a panel that contains genes associated with pancreatic, colon, and kidney cancers.  The Multi-Cancer Panel offered by [facility] includes sequencing and/or deletion duplication testing of the following 85 genes: AIP, ALK, APC, ATM, AXIN2,BAP1,  BARD1, BLM, BMPR1A, BRCA1, BRCA2, BRIP1, CASR, CDC73, CDH1, CDK4, CDKN1B, CDKN1C, CDKN2A (p14ARF), CDKN2A (p16INK4a), CEBPA, CHEK2, CTNNA1,  DICER1, DIS3L2, EGFR (c.2369C>T, p.Thr790Met variant only), EPCAM (Deletion/duplication testing only), FH, FLCN, GATA2, GPC3, GREM1 (Promoter region deletion/duplication testing only), HOXB13 (c.251G>A, p.Gly84Glu), HRAS, KIT, MAX, MEN1, MET, MITF (c.952G>A, p.Glu318Lys variant only), MLH1, MSH2, MSH3, MSH6, MUTYH, NBN, NF1, NF2, NTHL1, PALB2, PDGFRA, PHOX2B, PMS2, POLD1, POLE, POT1, PRKAR1A, PTCH1, PTEN, RAD50, RAD51C, RAD51D, RB1, RECQL4, RET, RNF43, RUNX1, SDHAF2, SDHA (sequence changes only), SDHB, SDHC, SDHD, SMAD4, SMARCA4, SMARCB1, SMARCE1, STK11, SUFU, TERC, TERT, TMEM127, TP53, TSC1, TSC2, VHL, WRN and WT1.   Based on Ms. Privitera's family history of pancreatic and colon cancer, she meets medical criteria for genetic testing. Despite that she meets criteria, she may still have an out of pocket cost. We discussed that if her out of pocket cost for testing is over $100, the laboratory will call and confirm whether she wants to proceed  with testing.  If the out of pocket cost of testing is less than $100 she will be billed by the genetic testing laboratory.   We discussed that some people do not want to undergo genetic testing due to fear of genetic discrimination.  A federal law called the Genetic Information Non-Discrimination Act (GINA) of 2008 helps protect individuals against genetic discrimination based on their genetic test results.  It impacts both health insurance and employment.  With health insurance, it protects against increased premiums, being kicked off insurance or being forced to take a test in order to be insured.  For employment it protects against hiring, firing and promoting decisions based on genetic test results.  GINA does not apply to those in the [employer], those who work for companies with less than 15 employees, and new life insurance or long-term disability insurance policies.  Health status due to a cancer diagnosis is not protected under GINA.  PLAN: After considering the risks,  benefits, and limitations, Ms. Tomer provided informed consent to pursue genetic testing and the blood sample was sent to [facility] for analysis of the Multi-Cancer Panel. Results should be available within approximately 3 weeks' time, at which point they will be disclosed by telephone to Ms. Jalbert, as will any additional recommendations warranted by these results. Ms. Birchler will receive a summary of her genetic counseling visit and a copy of her results once available. This information will also be available in Epic.   Based on Ms. Friedly's family history, we recommended her sister, who was diagnosed with pancreatic/colon cancer in her 90s, have genetic counseling and testing. Ms. Betsill will let us know if we can be of any assistance in coordinating genetic counseling and/or testing for this family member.   Lastly, we encouraged Ms. Shell to remain in contact with cancer genetics annually so that we can continuously update the family history and inform her of any changes in cancer genetics and testing that may be of benefit for this family.   Ms. Hanneman questions were answered to her satisfaction today. Our contact information was provided should additional questions or concerns arise. Thank you for the referral and allowing [place] to share in the care of your patient.   Florence Antonelli M. Joette Catching, [place], [place] [email or web address] (P) [phone]   The patient was seen for a total of 40 minutes in face-to-face genetic counseling.  This patient was discussed with Drs. Magrinat, Lindi Adie and/or Burr Medico who agrees with the above.   _______________________________________________________________________ For Office Staff:  Number of people involved in session: 1 Was an Intern/ student involved with case: yes; prospective genetic counseling student, Lovena Le, observed this session

## 2020-02-05 ENCOUNTER — Encounter: Payer: Self-pay | Admitting: Genetic Counselor

## 2020-02-05 ENCOUNTER — Telehealth: Payer: Self-pay | Admitting: Genetic Counselor

## 2020-02-05 DIAGNOSIS — Z1379 Encounter for other screening for genetic and chromosomal anomalies: Secondary | ICD-10-CM | POA: Insufficient documentation

## 2020-02-05 NOTE — Telephone Encounter (Signed)
Contacted patient in attempt to disclose results of genetic testing.  LVM with contact information requesting a call back.  

## 2020-02-17 ENCOUNTER — Encounter: Payer: Self-pay | Admitting: Genetic Counselor

## 2020-02-17 ENCOUNTER — Ambulatory Visit: Payer: Self-pay | Admitting: Genetic Counselor

## 2020-02-17 DIAGNOSIS — Z8051 Family history of malignant neoplasm of kidney: Secondary | ICD-10-CM

## 2020-02-17 DIAGNOSIS — Z800 Family history of malignant neoplasm of digestive organs: Secondary | ICD-10-CM

## 2020-02-17 DIAGNOSIS — Z1379 Encounter for other screening for genetic and chromosomal anomalies: Secondary | ICD-10-CM

## 2020-02-17 DIAGNOSIS — Z801 Family history of malignant neoplasm of trachea, bronchus and lung: Secondary | ICD-10-CM

## 2020-02-17 NOTE — Progress Notes (Addendum)
HPI:  Ms. Dettmann was previously seen in the [facility] due to a family history of cancer and concerns regarding a hereditary predisposition to cancer. Please refer to our prior cancer genetics clinic note for more information regarding our discussion, assessment and recommendations, at the time. Ms. Drumheller recent genetic test results were disclosed to her, as were recommendations warranted by these results. These results and recommendations are discussed in more detail below.  CANCER HISTORY:  Ms. Mcdiarmid is a 49 y.o. female with no personal history of cancer.    FAMILY HISTORY:  We obtained a detailed, 4-generation family history.  Significant diagnoses are listed below: Family History  Problem Relation Age of Onset   Lung cancer Mother        unknown age of onset   Kidney cancer Mother 39   Pancreatic cancer Sister        dx 66s   Colon cancer Sister        dx 70s   Lung cancer Maternal Grandfather        d. 5s   Ms. Zahradnik has five sisters, one of whom is reported to have a history of pancreatic and/or colon cancer diagnosed in her 14s.  She did not have genetic testing.  Ms. Roat mother had kidney cancer at age 56 and lung cancer at an unknown age.  Ms. Galbreath mother passed away at age 86.  Ms. Harpham maternal grandfather had a history of lung cancer and passed away in his 13s.  No other maternal family history of cancer was reported.  Ms. Willig had limited information about her father and did not reported any known paternal family history.    Ms. Anastas is unaware of previous family history of genetic testing for hereditary cancer risks. Patient's maternal ancestors are of [place] descent, and paternal ancestors are of [place] descent. There is no reported Ashkenazi Jewish ancestry. There is no known consanguinity.  GENETIC TEST RESULTS: Genetic testing reported out on February 05, 2020.  The Multi-Cancer Panel through [facility] found no pathogenic mutations. The  Multi-Cancer Panel offered by [facility] includes sequencing and/or deletion duplication testing of the following 85 genes: AIP, ALK, APC, ATM, AXIN2,BAP1,  BARD1, BLM, BMPR1A, BRCA1, BRCA2, BRIP1, CASR, CDC73, CDH1, CDK4, CDKN1B, CDKN1C, CDKN2A (p14ARF), CDKN2A (p16INK4a), CEBPA, CHEK2, CTNNA1, DICER1, DIS3L2, EGFR (c.2369C>T, p.Thr790Met variant only), EPCAM (Deletion/duplication testing only), FH, FLCN, GATA2, GPC3, GREM1 (Promoter region deletion/duplication testing only), HOXB13 (c.251G>A, p.Gly84Glu), HRAS, KIT, MAX, MEN1, MET, MITF (c.952G>A, p.Glu318Lys variant only), MLH1, MSH2, MSH3, MSH6, MUTYH, NBN, NF1, NF2, NTHL1, PALB2, PDGFRA, PHOX2B, PMS2, POLD1, POLE, POT1, PRKAR1A, PTCH1, PTEN, RAD50, RAD51C, RAD51D, RB1, RECQL4, RET, RNF43, RUNX1, SDHAF2, SDHA (sequence changes only), SDHB, SDHC, SDHD, SMAD4, SMARCA4, SMARCB1, SMARCE1, STK11, SUFU, TERC, TERT, TMEM127, TP53, TSC1, TSC2, VHL, WRN and WT1.   The test report has been scanned into EPIC and is located under the Molecular Pathology section of the Results Review tab.  A portion of the result report is included below for reference.     We discussed with Ms. Lingenfelter that because current genetic testing is not perfect, it is possible there may be a gene mutation in one of these genes that current testing cannot detect, but that chance is small.  We also discussed, that there could be another gene that has not yet been discovered, or that we have not yet tested, that is responsible for the cancer diagnoses in the family. It is also possible there is a hereditary  cause for the cancer in the family that Ms. Garden did not inherit and therefore was not identified in her testing.  Therefore, it is important to remain in touch with cancer genetics in the future so that we can continue to offer Ms. All the most up to date genetic testing.   Genetic testing did identify three variants of uncertain significance (VUS) - one in the APC gene called c.7501G>T  (p.Ala2501Ser), a second in the BRIP1 gene called c.2324A>G (p.Asn775Ser), and a third in the POLE gene called c.6658-18G>A (Intronic).  At this time, it is unknown if these variants are associated with increased cancer risk or if they are normal findings, but most variants such as these get reclassified to being inconsequential. They should not be used to make medical management decisions. With time, we suspect the lab will determine the significance of these variants, if any. If we do learn more about them, we will try to contact Ms. Kustra to discuss it further. However, it is important to stay in touch with [place] periodically and keep the address and phone number up to date.  The variant of uncertain significance (VUS) in POLE at  c.6658-18G>A [facility]) has been reclassified to likely bengin.  The change in variant classification was made as a result of re-review of evidence in light of new variant interpretation guidelines and/or new information. The amended report date is September 06, 2020.     ADDITIONAL GENETIC TESTING: We discussed with Ms. Floresca that her genetic testing was fairly extensive.  If there are genes identified to increase cancer risk that can be analyzed in the future, we would be happy to discuss and coordinate this testing at that time.    CANCER SCREENING RECOMMENDATIONS: Ms. Perkovich test result is considered negative (normal).  This means that we have not identified a hereditary cause for her family history of cancer at this time. Most cancers happen by chance and this negative test suggests that her cancer may fall into this category.    While reassuring, this does not definitively rule out a hereditary predisposition to cancer. It is still possible that there could be genetic mutations that are undetectable by current technology. There could be genetic mutations in genes that have not been tested or identified to increase cancer risk.  Therefore, it is recommended she continue to follow  the cancer management and screening guidelines provided by her  primary healthcare provider.   An individual's cancer risk and medical management are not determined by genetic test results alone. Overall cancer risk assessment incorporates additional factors, including personal medical history, family history, and any available genetic information that may result in a personalized plan for cancer prevention and surveillance  RECOMMENDATIONS FOR FAMILY MEMBERS:  Individuals in this family might be at some increased risk of developing cancer, over the general population risk, simply due to the family history of cancer.  We recommended women in this family have a yearly mammogram beginning at age 18, or 40 years younger than the earliest onset of cancer, an annual clinical breast exam, and perform monthly breast self-exams. Women in this family should also have a gynecological exam as recommended by their primary provider.  First degree relatives of those with colon cancer should receive colonoscopies beginning at age 48, or 10 years prior to the earliest diagnosis of colon cancer in the family, and receive colonoscopies at least every 5 years, or as recommended by their gastroenterologist.    It is also possible there is a  hereditary cause for the cancer in Ms. Pau's family that she did not inherit and therefore was not identified in her.  Based on Ms. Otte's family history, we recommended her sister, who was diagnosed with colon and/or pancreatic cancer, have genetic counseling and testing. Ms. Galati will let us know if we can be of any assistance in coordinating genetic counseling and/or testing for this family member.   FOLLOW-UP: Lastly, we discussed with Ms. Regula that cancer genetics is a rapidly advancing field and it is possible that new genetic tests will be appropriate for her and/or her family members in the future. We encouraged her to remain in contact with cancer genetics on an annual basis so we  can update her personal and family histories and let her know of advances in cancer genetics that may benefit this family.   Our contact number was provided. Ms. Fernando questions were answered to her satisfaction, and she knows she is welcome to call us at anytime with additional questions or concerns.   Sahara Fujimoto M. Joette Catching, [place], [facility] Genetic Counselor [email or web address] (P) [phone]

## 2020-02-17 NOTE — Telephone Encounter (Signed)
Revealed negative genetic testing and variants of uncertain significance in APC, BRIP1, and POLE.  Discussed that we do not know why there is cancer in the family. It could be sporadic/familial, due to a familial mutation she did not inherit, due to a different gene that we are not testing, or maybe our current technology may not be able to pick something up.  It will be important for her to keep in contact with genetics to keep up with whether additional testing may be needed.

## 2020-09-14 ENCOUNTER — Encounter: Payer: Self-pay | Admitting: Genetic Counselor

## 2021-04-07 ENCOUNTER — Ambulatory Visit: Payer: Medicaid Other | Admitting: Podiatry

## 2021-04-07 ENCOUNTER — Ambulatory Visit (INDEPENDENT_AMBULATORY_CARE_PROVIDER_SITE_OTHER): Payer: Medicaid Other

## 2021-04-07 ENCOUNTER — Other Ambulatory Visit: Payer: Self-pay

## 2021-04-07 DIAGNOSIS — M2011 Hallux valgus (acquired), right foot: Secondary | ICD-10-CM | POA: Diagnosis not present

## 2021-04-07 DIAGNOSIS — M21611 Bunion of right foot: Secondary | ICD-10-CM

## 2021-04-07 DIAGNOSIS — Z01818 Encounter for other preprocedural examination: Secondary | ICD-10-CM

## 2021-04-07 DIAGNOSIS — M21619 Bunion of unspecified foot: Secondary | ICD-10-CM

## 2021-04-12 NOTE — Progress Notes (Addendum)
Subjective:  Patient ID: [name], female    DOB: May 10, 1970,  MRN: [id number]  Chief Complaint  Patient presents with   Bunions    Right foot bunion     51 y.o. female presents with the above complaint.  Patient presents with severe bunion deformity that has been going for quite some time is progressive gotten worse.  She has tried all conservative treatment options which includes shoe gear modification, orthotics, padding, protecting/offloading.  None of these conservative treatment options has helped.  Pain scale is 7 out of 10 hurts with ambulation.  Pain with every step that she takes.  Is dull achy in nature.  She would like to discuss treatment options for it including surgical options given that she has been dealing with this for long period of time.  She denies any other acute complaints.  Review of Systems: Negative except as noted in the HPI. Denies N/V/F/Ch.  Past Medical History:  Diagnosis Date   Asthma    Hypertension    Renal disorder     Current Outpatient Medications:    cephALEXin (KEFLEX) 500 MG capsule, Take 1 capsule (500 mg total) by mouth 4 (four) times daily. x10 days (Patient not taking: Reported on 06/13/2014), Disp: 40 capsule, Rfl: 0   furosemide (LASIX) 20 MG tablet, Take 1 tablet (20 mg total) by mouth daily., Disp: 3 tablet, Rfl: 0   HYDROcodone-acetaminophen (NORCO/VICODIN) 5-325 MG per tablet, Take 1-2 tablets by mouth every 4 (four) hours as needed. (Patient not taking: Reported on 06/13/2014), Disp: 10 tablet, Rfl: 0   ibuprofen (ADVIL,MOTRIN) 200 MG tablet, Take 400 mg by mouth every 6 (six) hours as needed for moderate pain. , Disp: , Rfl:    potassium chloride (K-DUR) 10 MEQ tablet, Take 1 tablet (10 mEq total) by mouth daily., Disp: 3 tablet, Rfl: 0  Social History   Tobacco Use  Smoking Status Never  Smokeless Tobacco Not on file    No Known Allergies Objective:  There were no vitals filed for this visit. There is no height or weight on  file to calculate BMI. Constitutional Well developed. Well nourished.  Vascular Dorsalis pedis pulses palpable bilaterally. Posterior tibial pulses palpable bilaterally. Capillary refill normal to all digits.  No cyanosis or clubbing noted. Pedal hair growth normal.  Neurologic Normal speech. Oriented to person, place, and time. Epicritic sensation to light touch grossly present bilaterally.  Dermatologic Nails well groomed and normal in appearance. No open wounds. No skin lesions.  Orthopedic: Normal joint ROM without pain or crepitus bilaterally. Hallux abductovalgus deformity present.  No intra-articular first MPJ pain noted with range of motion.  This is a track bound not a tracking deformity. Left 1st MPJ full range of motion. Left 1st TMT without gross hypermobility. Right 1st MPJ diminished range of motion  Right 1st TMT with gross hypermobility. Lesser digital contractures absent bilaterally.   Radiographs: Taken and reviewed. Hallux abductovalgus deformity present. Metatarsal parabola normal. 1st/2nd IMA: Severe greater than 15 degrees; TSP: 6 out of 7.  There is increasing hallux valgus angle.  Valgus rotation noted.  Assessment:   1. Hav (hallux abducto valgus), right   2. Hallux interphalangeus, acquired, right   3. Encounter for preoperative examination for general surgical procedure    Plan:  Patient was evaluated and treated and all questions answered.  Hallux abductovalgus deformity, right -XR as above. -Patient has failed all conservative therapy and wishes to proceed with surgical intervention. All risks, benefits, and alternatives discussed  with patient. No guarantees given. Consent reviewed and signed by patient. Post-op course explained at length. -Planned procedures: Right foot Lapidus fusion with Lapiplasty with a possible phalangeal osteotomy -Risk factors: None. -I explained to the patient my preoperative intraoperative postoperative plan in extensive  detail.  Given the amount of pain that she is experiencing she will benefit from surgical intervention with Lapidus fusion with Lapa plasty.  I discussed my approach surgical plan in extensive detail she states understanding.  She will be nonweightbearing for the first 3 weeks followed by weightbearing as tolerated the boot.  She denies any other acute issues.  No follow-ups on file.

## 2021-05-05 ENCOUNTER — Other Ambulatory Visit: Payer: Self-pay | Admitting: Podiatry

## 2021-05-05 NOTE — Progress Notes (Deleted)
?Subjective:  ?Patient ID: [name], female    DOB: 1970/05/09,  MRN: [id number] ? ?No chief complaint on file. ? ? ?51 y.o. female presents with the above complaint.  Patient presents with severe bunion deformity that has been going for quite some time is progressive gotten worse.  She has tried all conservative treatment options which included padding protecting shoe gear modification orthotics management.  None of the conservative treatment options has helped with her pain.  Pain scale is 7 out of 10 hurts with ambulation.  Pain with every step that she takes.  Is dull achy in nature.  She would like to discuss treatment options for it including surgical options given that she has been dealing with this for long period of time.  She denies any other acute complaints. ? ?Review of Systems: Negative except as noted in the HPI. Denies N/V/F/Ch. ? ?Past Medical History:  ?Diagnosis Date  ? Asthma   ? Hypertension   ? Renal disorder   ? ? ?Current Outpatient Medications:  ?  cephALEXin (KEFLEX) 500 MG capsule, Take 1 capsule (500 mg total) by mouth 4 (four) times daily. x10 days (Patient not taking: Reported on 06/13/2014), Disp: 40 capsule, Rfl: 0 ?  furosemide (LASIX) 20 MG tablet, Take 1 tablet (20 mg total) by mouth daily., Disp: 3 tablet, Rfl: 0 ?  HYDROcodone-acetaminophen (NORCO/VICODIN) 5-325 MG per tablet, Take 1-2 tablets by mouth every 4 (four) hours as needed. (Patient not taking: Reported on 06/13/2014), Disp: 10 tablet, Rfl: 0 ?  ibuprofen (ADVIL,MOTRIN) 200 MG tablet, Take 400 mg by mouth every 6 (six) hours as needed for moderate pain. , Disp: , Rfl:  ?  potassium chloride (K-DUR) 10 MEQ tablet, Take 1 tablet (10 mEq total) by mouth daily., Disp: 3 tablet, Rfl: 0 ? ?Social History  ? ?Tobacco Use  ?Smoking Status Never  ?Smokeless Tobacco Not on file  ? ? ?No Known Allergies ?Objective:  ?There were no vitals filed for this visit. ?There is no height or weight on file to calculate BMI. ?Constitutional  Well developed. ?Well nourished.  ?Vascular Dorsalis pedis pulses palpable bilaterally. ?Posterior tibial pulses palpable bilaterally. ?Capillary refill normal to all digits.  ?No cyanosis or clubbing noted. ?Pedal hair growth normal.  ?Neurologic Normal speech. ?Oriented to person, place, and time. ?Epicritic sensation to light touch grossly present bilaterally.  ?Dermatologic Nails well groomed and normal in appearance. ?No open wounds. ?No skin lesions.  ?Orthopedic: Normal joint ROM without pain or crepitus bilaterally. ?Hallux abductovalgus deformity present.  No intra-articular first MPJ pain noted with range of motion.  This is a track bound not a tracking deformity. ?Left 1st MPJ full range of motion. ?Left 1st TMT without gross hypermobility. ?Right 1st MPJ diminished range of motion  ?Right 1st TMT with gross hypermobility. ?Lesser digital contractures absent bilaterally.  ? ?Radiographs: ?Taken and reviewed. Hallux abductovalgus deformity present. Metatarsal parabola normal. 1st/2nd IMA: Severe greater than 15 degrees; TSP: 6 out of 7.  There is increasing hallux valgus angle.  Valgus rotation noted. ? ?Assessment:  ? ?No diagnosis found. ? ?Plan:  ?Patient was evaluated and treated and all questions answered. ? ?Hallux abductovalgus deformity, right ?-XR as above. ?-Patient has failed all conservative therapy and wishes to proceed with surgical intervention. All risks, benefits, and alternatives discussed with patient. No guarantees given. Consent reviewed and signed by patient. Post-op course explained at length. ?-Planned procedures: Right foot Lapidus fusion with Lapiplasty with a possible phalangeal osteotomy ?-Risk factors:  None. ?-I explained to the patient my preoperative intraoperative postoperative plan in extensive detail.  Given the amount of pain that she is experiencing she will benefit from surgical intervention with Lapidus fusion with Lapa plasty.  I discussed my approach surgical plan in  extensive detail she states understanding.  She will be nonweightbearing for the first 3 weeks followed by weightbearing as tolerated the boot.  She denies any other acute issues. ? ?No follow-ups on file. ? ?

## 2021-05-15 ENCOUNTER — Telehealth: Payer: Self-pay | Admitting: Urology

## 2021-05-15 NOTE — Telephone Encounter (Signed)
DOS - 06/05/21 ? ?[facility] OSTEOTOMY RIGHT --- [phone] ?LAPIDUS PROCEDURE INCLUDING BUNIONECTOMY RIGHT --- [phone] ? ?[place]UE MEDICAID EFFECTIVE DATE - 08/20/19 ? ?PLAN DEDUCTIBLE - $0.00 ?OUT OF POCKET - $0.00 ?COINSURANCE - 0% ?COPAY - $4.00 ? ? ?SPOKE WITH [facility] WITH [employer] SHE STATED THAT FOR CPT CODES 56387 AND [phone] HAS BEEN APPROVED, AUTH # [id number], GOOD FROM 06/05/21 - 08/03/21. ? ? ?REF # [id number] ? ? ? ?

## 2021-05-29 ENCOUNTER — Encounter (HOSPITAL_BASED_OUTPATIENT_CLINIC_OR_DEPARTMENT_OTHER): Payer: Self-pay | Admitting: Podiatry

## 2021-05-29 ENCOUNTER — Other Ambulatory Visit: Payer: Self-pay

## 2021-05-29 NOTE — Progress Notes (Addendum)
ADDENDUM:  received pt's pcp, Dr Drucilla Chalet, H&P dated 05-17-2021 via fax from Dr Allena Katz office, placed in chart. ? ? ?ADDENDUM:  received pt's 12 lead ekg tracing from [facility] via fax, dated 03-14-2021, placed in chart. ? ?Spoke w/ via phone for pre-op interview--- pt ?Lab needs dos----  [employer], uring preg,. And ekg if one requested that done at her pcp office is not on chart dos             ?Lab results------ no ?COVID test -----patient states asymptomatic no test needed ?Arrive at ------- 0530 on 06-05-2021 ?NPO after MN  ?Med rec completed ?Medications to take morning of surgery ----- none ?Diabetic medication ----- do not take metformin morning of surgery ?Patient instructed no nail polish to be worn day of surgery ?Patient instructed to bring photo id and insurance card day of surgery ?Patient aware to have Driver (ride ) / caregiver for 24 hours after surgery ---husband, cont tran ?Patient Special Instructions ----- n/a ?Pre-Op special Istructions ----- have not received pt's pcp H&P yet from Dr Allena Katz office. ?Patient verbalized understanding of instructions that were given at this phone interview. ?Patient denies shortness of breath, chest pain, fever, cough at this phone interview.  ?

## 2021-06-02 NOTE — Progress Notes (Signed)
Left message for patient to arrive at 0700 on 05/05/2021 instead of 0530 for surgery.  ?

## 2021-06-02 NOTE — H&P (View-Only) (Signed)
Left message for patient to arrive at 0700 on 05/05/2021 instead of 0530 for surgery.  ?

## 2021-06-05 ENCOUNTER — Ambulatory Visit (HOSPITAL_BASED_OUTPATIENT_CLINIC_OR_DEPARTMENT_OTHER): Payer: Medicaid Other

## 2021-06-05 ENCOUNTER — Ambulatory Visit (HOSPITAL_BASED_OUTPATIENT_CLINIC_OR_DEPARTMENT_OTHER)
Admission: RE | Admit: 2021-06-05 | Discharge: 2021-06-05 | Disposition: A | Discharge status: Home / Self Care | Payer: Medicaid Other | Attending: Podiatry | Admitting: Podiatry

## 2021-06-05 ENCOUNTER — Other Ambulatory Visit: Payer: Self-pay | Admitting: Podiatry

## 2021-06-05 ENCOUNTER — Ambulatory Visit (HOSPITAL_COMMUNITY): Payer: Medicaid Other

## 2021-06-05 ENCOUNTER — Ambulatory Visit (HOSPITAL_BASED_OUTPATIENT_CLINIC_OR_DEPARTMENT_OTHER): Payer: Medicaid Other | Admitting: Certified Registered Nurse Anesthetist

## 2021-06-05 ENCOUNTER — Encounter (HOSPITAL_BASED_OUTPATIENT_CLINIC_OR_DEPARTMENT_OTHER)
Admission: RE | Disposition: A | Discharge status: Home / Self Care | Payer: Self-pay | Source: Home / Self Care | Attending: Podiatry

## 2021-06-05 ENCOUNTER — Other Ambulatory Visit: Payer: Self-pay

## 2021-06-05 ENCOUNTER — Encounter (HOSPITAL_BASED_OUTPATIENT_CLINIC_OR_DEPARTMENT_OTHER): Payer: Self-pay | Admitting: Podiatry

## 2021-06-05 DIAGNOSIS — I10 Essential (primary) hypertension: Secondary | ICD-10-CM | POA: Insufficient documentation

## 2021-06-05 DIAGNOSIS — J4520 Mild intermittent asthma, uncomplicated: Secondary | ICD-10-CM | POA: Diagnosis not present

## 2021-06-05 DIAGNOSIS — Z8616 Personal history of COVID-19: Secondary | ICD-10-CM | POA: Diagnosis not present

## 2021-06-05 DIAGNOSIS — E119 Type 2 diabetes mellitus without complications: Secondary | ICD-10-CM | POA: Diagnosis not present

## 2021-06-05 DIAGNOSIS — K219 Gastro-esophageal reflux disease without esophagitis: Secondary | ICD-10-CM | POA: Diagnosis not present

## 2021-06-05 DIAGNOSIS — Z79899 Other long term (current) drug therapy: Secondary | ICD-10-CM | POA: Diagnosis not present

## 2021-06-05 DIAGNOSIS — Z7984 Long term (current) use of oral hypoglycemic drugs: Secondary | ICD-10-CM | POA: Insufficient documentation

## 2021-06-05 DIAGNOSIS — M21611 Bunion of right foot: Secondary | ICD-10-CM | POA: Diagnosis not present

## 2021-06-05 DIAGNOSIS — M2011 Hallux valgus (acquired), right foot: Secondary | ICD-10-CM | POA: Diagnosis not present

## 2021-06-05 HISTORY — DX: Allergic rhinitis, unspecified: J30.9

## 2021-06-05 HISTORY — DX: Gastro-esophageal reflux disease without esophagitis: K21.9

## 2021-06-05 HISTORY — PX: AIKEN OSTEOTOMY: SHX6331

## 2021-06-05 HISTORY — DX: Type 2 diabetes mellitus without complications: E11.9

## 2021-06-05 HISTORY — DX: Mild intermittent asthma, uncomplicated: J45.20

## 2021-06-05 HISTORY — DX: Presence of spectacles and contact lenses: Z97.3

## 2021-06-05 HISTORY — DX: Irritable bowel syndrome, unspecified: K58.9

## 2021-06-05 HISTORY — DX: Vitamin D deficiency, unspecified: E55.9

## 2021-06-05 HISTORY — DX: Other seasonal allergic rhinitis: J30.2

## 2021-06-05 HISTORY — PX: BUNIONECTOMY: SHX129

## 2021-06-05 LAB — POCT I-STAT, CHEM 8
BUN: 12 mg/dL (ref 6–20)
Calcium, Ion: 1.22 mmol/L (ref 1.15–1.40)
Chloride: 99 mmol/L (ref 98–111)
Creatinine, Ser: 0.4 mg/dL — ABNORMAL LOW (ref 0.44–1.00)
Glucose, Bld: 186 mg/dL — ABNORMAL HIGH (ref 70–99)
HCT: 42 % (ref 36.0–46.0)
Hemoglobin: 14.3 g/dL (ref 12.0–15.0)
Potassium: 3.4 mmol/L — ABNORMAL LOW (ref 3.5–5.1)
Sodium: 138 mmol/L (ref 135–145)
TCO2: 27 mmol/L (ref 22–32)

## 2021-06-05 LAB — GLUCOSE, CAPILLARY: Glucose-Capillary: 212 mg/dL — ABNORMAL HIGH (ref 70–99)

## 2021-06-05 LAB — POCT PREGNANCY, URINE: Preg Test, Ur: NEGATIVE

## 2021-06-05 SURGERY — BUNIONECTOMY
Anesthesia: General | Site: Toe | Laterality: Right

## 2021-06-05 MED ORDER — 0.9 % SODIUM CHLORIDE (POUR BTL) OPTIME
TOPICAL | Status: DC | PRN
  Administered 2021-06-05: 500 mL

## 2021-06-05 MED ORDER — ACETAMINOPHEN 500 MG PO TABS
1000.0000 mg | ORAL_TABLET | Freq: Once | ORAL | Status: AC
  Administered 2021-06-05: 1000 mg via ORAL

## 2021-06-05 MED ORDER — DEXAMETHASONE SODIUM PHOSPHATE 10 MG/ML IJ SOLN
INTRAMUSCULAR | Status: DC | PRN
  Administered 2021-06-05: 5 mg via INTRAVENOUS

## 2021-06-05 MED ORDER — BUPIVACAINE-EPINEPHRINE (PF) 0.5% -1:200000 IJ SOLN
INTRAMUSCULAR | Status: DC | PRN
  Administered 2021-06-05: 30 mL via PERINEURAL

## 2021-06-05 MED ORDER — CEFAZOLIN SODIUM-DEXTROSE 2-4 GM/100ML-% IV SOLN
INTRAVENOUS | Status: AC
  Filled 2021-06-05: qty 100

## 2021-06-05 MED ORDER — LACTATED RINGERS IV SOLN: INTRAVENOUS | Status: DC

## 2021-06-05 MED ORDER — ACETAMINOPHEN 500 MG PO TABS
ORAL_TABLET | ORAL | Status: AC
  Filled 2021-06-05: qty 2

## 2021-06-05 MED ORDER — FENTANYL CITRATE (PF) 100 MCG/2ML IJ SOLN
INTRAMUSCULAR | Status: AC
Start: 2021-06-05 — End: ?
  Filled 2021-06-05: qty 2

## 2021-06-05 MED ORDER — FENTANYL CITRATE (PF) 100 MCG/2ML IJ SOLN
INTRAMUSCULAR | Status: AC
  Filled 2021-06-05: qty 2

## 2021-06-05 MED ORDER — OXYCODONE HCL 5 MG PO TABS
5.0000 mg | ORAL_TABLET | Freq: Once | ORAL | Status: AC
  Administered 2021-06-05: 5 mg via ORAL

## 2021-06-05 MED ORDER — MIDAZOLAM HCL 2 MG/2ML IJ SOLN
2.0000 mg | Freq: Once | INTRAMUSCULAR | Status: AC
  Administered 2021-06-05: 2 mg via INTRAVENOUS

## 2021-06-05 MED ORDER — PROPOFOL 10 MG/ML IV BOLUS
INTRAVENOUS | Status: DC | PRN
  Administered 2021-06-05: 130 mg via INTRAVENOUS

## 2021-06-05 MED ORDER — MIDAZOLAM HCL 2 MG/2ML IJ SOLN
INTRAMUSCULAR | Status: AC
  Filled 2021-06-05: qty 2

## 2021-06-05 MED ORDER — FENTANYL CITRATE (PF) 100 MCG/2ML IJ SOLN
25.0000 ug | INTRAMUSCULAR | Status: DC | PRN
  Administered 2021-06-05 (×2): 50 ug via INTRAVENOUS

## 2021-06-05 MED ORDER — FENTANYL CITRATE (PF) 100 MCG/2ML IJ SOLN
50.0000 ug | Freq: Once | INTRAMUSCULAR | Status: AC
  Administered 2021-06-05: 50 ug via INTRAVENOUS

## 2021-06-05 MED ORDER — IBUPROFEN 800 MG PO TABS: 800.0000 mg | ORAL_TABLET | Freq: Four times a day (QID) | ORAL | 1 refills | Status: AC | PRN

## 2021-06-05 MED ORDER — ONDANSETRON HCL 4 MG/2ML IJ SOLN
INTRAMUSCULAR | Status: DC | PRN
  Administered 2021-06-05: 4 mg via INTRAVENOUS

## 2021-06-05 MED ORDER — CEFAZOLIN SODIUM-DEXTROSE 2-4 GM/100ML-% IV SOLN
2.0000 g | Freq: Once | INTRAVENOUS | Status: AC
  Administered 2021-06-05: 2 g via INTRAVENOUS

## 2021-06-05 MED ORDER — OXYCODONE HCL 5 MG PO TABS
ORAL_TABLET | ORAL | Status: AC
  Filled 2021-06-05: qty 1

## 2021-06-05 MED ORDER — PROPOFOL 10 MG/ML IV BOLUS
INTRAVENOUS | Status: AC
  Filled 2021-06-05: qty 20

## 2021-06-05 MED ORDER — OXYCODONE-ACETAMINOPHEN 5-325 MG PO TABS
1.0000 | ORAL_TABLET | ORAL | 0 refills | Status: AC | PRN
Start: 2021-06-05 — End: ?

## 2021-06-05 SURGICAL SUPPLY — 59 items
BLADE AVERAGE 25X9 (BLADE) IMPLANT
BLADE OSC/SAG .038X5.5 CUT EDG (BLADE) ×3 IMPLANT
BLADE OSCILLATING/SAGITTAL (BLADE) ×2
BLADE SAW LAPIPLASTY 40X11 (BLADE) ×1 IMPLANT
BLADE SURG 15 STRL LF DISP TIS (BLADE) ×6 IMPLANT
BLADE SURG 15 STRL SS (BLADE) ×6
BLADE SW THK.38XMED LNG THN (BLADE) ×2 IMPLANT
BNDG CMPR 9X4 STRL LF SNTH (GAUZE/BANDAGES/DRESSINGS) ×1
BNDG ELASTIC 4X5.8 VLCR STR LF (GAUZE/BANDAGES/DRESSINGS) ×1 IMPLANT
BNDG ESMARK 4X9 LF (GAUZE/BANDAGES/DRESSINGS) ×3 IMPLANT
BNDG GAUZE ELAST 4 BULKY (GAUZE/BANDAGES/DRESSINGS) ×3 IMPLANT
BUR OVAL CARBIDE 4.0 (BURR) IMPLANT
COVER BACK TABLE 60X90IN (DRAPES) ×3 IMPLANT
COVER MAYO STAND STRL (DRAPES) IMPLANT
CUFF TOURN SGL QUICK 24 (TOURNIQUET CUFF)
CUFF TOURN SGL QUICK 34 (TOURNIQUET CUFF)
CUFF TRNQT CYL 24X4X16.5-23 (TOURNIQUET CUFF) IMPLANT
CUFF TRNQT CYL 34X4.125X (TOURNIQUET CUFF) IMPLANT
DRAPE C-ARM 42X120 X-RAY (DRAPES) ×3 IMPLANT
DRAPE C-ARMOR (DRAPES) ×3 IMPLANT
DRAPE EXTREMITY T 121X128X90 (DISPOSABLE) ×3 IMPLANT
DRAPE IMP U-DRAPE 54X76 (DRAPES) ×3 IMPLANT
DRAPE U-SHAPE 47X51 STRL (DRAPES) ×3 IMPLANT
DRSG ADAPTIC 3X8 NADH LF (GAUZE/BANDAGES/DRESSINGS) IMPLANT
DURAPREP 26ML APPLICATOR (WOUND CARE) ×3 IMPLANT
ELECT REM PT RETURN 9FT ADLT (ELECTROSURGICAL) ×2
ELECTRODE REM PT RTRN 9FT ADLT (ELECTROSURGICAL) ×2 IMPLANT
GAUZE 4X4 16PLY ~~LOC~~+RFID DBL (SPONGE) ×3 IMPLANT
GAUZE SPONGE 4X4 12PLY STRL (GAUZE/BANDAGES/DRESSINGS) ×3 IMPLANT
GAUZE SPONGE 4X4 3PLY NS LF (GAUZE/BANDAGES/DRESSINGS) ×1 IMPLANT
GAUZE XEROFORM 1X8 LF (GAUZE/BANDAGES/DRESSINGS) ×3 IMPLANT
GLOVE BIO SURGEON STRL SZ7 (GLOVE) ×3 IMPLANT
GLOVE BIOGEL PI IND STRL 7.5 (GLOVE) ×2 IMPLANT
GLOVE BIOGEL PI INDICATOR 7.5 (GLOVE) ×1
GOWN STRL REUS W/TWL XL LVL3 (GOWN DISPOSABLE) ×3 IMPLANT
INST GUIDED SPEEDRELEASE (INSTRUMENTS) ×2
INSTRUMENT GUIDED SPEEDRELEASE (INSTRUMENTS) IMPLANT
KIT TURNOVER CYSTO (KITS) ×3 IMPLANT
LAPIPLASTY SYS 4A (Orthopedic Implant) ×2 IMPLANT
NS IRRIG 1000ML POUR BTL (IV SOLUTION) IMPLANT
PACK BASIN DAY SURGERY FS (CUSTOM PROCEDURE TRAY) ×3 IMPLANT
PENCIL SMOKE EVACUATOR (MISCELLANEOUS) ×3 IMPLANT
RASP SM TEAR CROSS CUT (RASP) IMPLANT
SCREW 2.7 HIGH PITCH LOCKING (Screw) ×2 IMPLANT
SPONGE T-LAP 4X18 ~~LOC~~+RFID (SPONGE) IMPLANT
STOCKINETTE 6  STRL (DRAPES) ×2
STOCKINETTE 6 STRL (DRAPES) ×2 IMPLANT
SUCTION FRAZIER HANDLE 10FR (MISCELLANEOUS) ×2
SUCTION TUBE FRAZIER 10FR DISP (MISCELLANEOUS) ×2 IMPLANT
SUT MNCRL AB 3-0 PS2 18 (SUTURE) ×3 IMPLANT
SUT MNCRL AB 4-0 PS2 18 (SUTURE) ×3 IMPLANT
SUT MON AB 5-0 PS2 18 (SUTURE) ×3 IMPLANT
SUT PROLENE 3 0 PS 2 (SUTURE) IMPLANT
SUT PROLENE 4 0 PS 2 18 (SUTURE) IMPLANT
SYR BULB EAR ULCER 3OZ GRN STR (SYRINGE) ×3 IMPLANT
SYSTEM LAPIPLASTY 4A (Orthopedic Implant) IMPLANT
TOWEL OR 17X26 10 PK STRL BLUE (TOWEL DISPOSABLE) ×3 IMPLANT
TUBE CONNECTING 12X1/4 (SUCTIONS) ×3 IMPLANT
UNDERPAD 30X36 HEAVY ABSORB (UNDERPADS AND DIAPERS) ×3 IMPLANT

## 2021-06-05 NOTE — Op Note (Signed)
Surgeon: Surgeon(s): ?Candelaria Stagers, DPM  ?Assistants: None ?Pre-operative diagnosis: BUNION  ?Post-operative diagnosis: same ?Procedure: Procedure(s) (LRB): ?LAPIDUS BUNIONECTOMY (Right) ?AIKEN OSTEOTOMY (Right)  ?Pathology: * No specimens in log *  ?Pertinent Intra-op findings: Severe bunion deformity noted ?Anesthesia: Choice  ?Hemostasis: * Missing tourniquet times found for documented tourniquets in log: [id number] * ?EBL: 5 mL  ?Materials: Lapiplasty Treace medical plates N4'O and screws x8 ?Injectables: None ?Complications: None ? ?Indications for surgery: ?A 51 y.o. female presents with right painful severe bunion deformity. Patient has failed all conservative therapy including but not limited to shoe gear modification offloading padding protecting. She wishes to have surgical correction of the foot/deformity. It was determined that patient would benefit from right Lapidus bunion procedure. ?Informed surgical risk consent was reviewed and read aloud to the patient.  I reviewed the films.  I have discussed my findings with the patient in great detail.  I have discussed all risks including but not limited to infection, stiffness, scarring, limp, disability, deformity, damage to blood vessels and nerves, numbness, poor healing, need for braces, arthritis, chronic pain, amputation, death.  All benefits and realistic expectations discussed in great detail.  I have made no promises as to the outcome.  I have provided realistic expectations.  I have offered the patient a 2nd opinion, which they have declined and assured me they preferred to proceed despite the risks ? ? ?Procedure in detail: ?The patient was both verbally and visually identified by myself, the nursing staff, and anesthesia staff in the preoperative holding area. They were then transferred to the operating room and placed on the operative table in supine position.  Tourniquet was inflated 250 mmHg pressure and calf tourniquet. ? ? Attention is then   ?directed at the 1st metatarsocuneiform joint where an incision is initiated at the proximal aspect of the  ?medial cuneiform down to the level of the mid shaft of the 1st metatarsal. A #15 Blade was then used  ?to deepen the incision through the subcutaneous tissues down the level of the periosteum. The  ?periosteum is then reflected at the 1st metatarsal and metatarsocuneiform joint and medial cuneiform.  ?All superficial bleeders were identified and bovied care was taken to retract the neurovascular  ?structures medial. At this point the 40 mm saw blade is used to reciprocally plane the  ?metatarsocuneiform joint a osteotome was then used to free the plantar ligaments, this is to allow  ?mobilization of the 1st metatarsal cuneiform joint in order to free and correct the frontal plane motion  ?with this bunion deformity. At this point a Glorious Peach is used to create a pocket at the base laterally of the  ?1st metatarsal. At this point a short K-wire is then placed at the base of the 1st metatarsal positioned  ?towards the 5th metatarsal head to the lateral cortex of the 1st metatarsal this joy stick is then used to  ?correct the frontal plane motion. At this point inspection under fluoroscopy shows that the sesamoids  ?cannot fully rotate under the 1st metatarsal head.  ?PROCEDURE: LATERAL CAPSULE AND SESAMOID RELEASE: At this point a linear incision is placed in the  ?right 1st interspace 15. Blade was used to deepen incision down through the subcutaneous tissue.  ?Metzenbaum scissors were used to bluntly dissect to the lateral capsule of the right foot.  Treace medical quick release was used in standard technique to perform the release without any complication.  Good correction release was noted afterwards. ? ?At this point  the 3.5 mm fulcrum is placed at the 1st metatarsal lateral base. A stab incision is then  ?placed at the dorsal aspect of the second metatarsal to allow placement of the the C-clamp. This is then   ?placed at the lateral 2nd metatarsal shaft and just distal to the medial ridge of the 1st metatarsal. The  ?C-clamp is then tightened to correct the intermetatarsal angle. At the same time the joystick is used to  ?rotate the 1st metatarsal and the right hallux was dorsiflexed to maintain the metatarsal in the sagittal  ?plane. Verification on the C-arm shows good correction of the bunion deformity in all 3 planes. A  ?smooth K-wire is then driven through the clamp to hold the corrected anatomic position. The speed  ?seeker is then placed in the joint and lateral aspect of the first metatarsal. At this point the gold cut  ?guide is placed over the speed seeker verification on the C-arm shows good placement of the cut guide  ?in line with the bisection of the 1st metatarsal. This was then secured in place with 3 short pins at this  ?point the saw blade was then used to make the cuneiform cut followed by the 1st metatarsal base cut.  ?The cuts are made through and through the plantar cortex. At this point an osteotome was used to free  ?the plantar ligaments. The bone fragments are then removed in toto. At this point the joint distractor is  ?placed over the short pins further inspection is made to ensure no plantar bone is remaining. The  ?wound is then flushed copious amounts sterile saline. Utilizing a 2.0 drill fenestration of the osteotomy  ?site was achieved on the 1st metatarsal and medial cuneiform. The at this point the fulcrum is placed on  ?the lateral aspect. In the osteotomy is closed. Verification on the C-arm shows no elevation of the 1st metatarsal and good approximation of the osteotomy. At this point a 2.0 olive wire is used to  ?provisionally fixate the osteotomy. This was placed from the lateral aspect of the 1st metatarsal across  ?the medial cuneiform medially. A smooth K-wires and driven from medial to lateral. At this point the  ?distractor is removed and the short pins are removed also.  Attention then directed at the dorsal lateral wound where a ?joint where a biplanar plate is placed at the lateral most aspect of the metatarsocuneiform joint  ?verification on the C-arm showed good placement and no violation of the inter cuneiform joint laterally.  ?This locking plate was secured utilizing 2.7 locking screws in the inner holes with a 14 mm length  ?followed by the outer most screw holes with 12 mm length screws. ?At this point the medial plate is placed securing the 14 mm length screws in inner holes and the 12 mm  ?screws on the outer holes. Verification on the C-arm showed good placement of the plates which is at a  ?90 degree construct. Delete and flushsterile saline the periosteal and subcutaneous  ?tissues were closed with 3-0 Vicryl. Prior to closure of the deeper tissues Zyn Relieff analgesic is injected  ?utilizing approximately 2 cc. Closure of the skin is achieved utilizing 3-0 Prolene with horizontal mattress  ?sutures. The 1st interspace incision is also closed with 3-0 Prolene as is the 2nd metatarsal incision.  ?Final radiographs show good anatomic reduction of the previous bunion deformity with good placement  ?of the plates. Marcaine is then injected as an infiltrative nerve block around  the 1st metatarsal base the  ?wound is dressed with Adaptic 4x4s and Kerlix. An Ace wrap was then applied around the foot. The calf  ?tourniquet is then deflated and color returned to the digits of the right foot x5. The patient left the operating room with vital signs stable and neurovascular status intact.  Sterile dressings were applied followed by CAM boot. Patient will be non-weightbearing with CAM boot. The patient was awakened and taken to the recovery room ? ? ? ?Boneta Lucks, DPM ? ?

## 2021-06-05 NOTE — Discharge Instructions (Addendum)
After Surgery Instructions ? ? ?1) If you are recuperating from surgery anywhere other than home, please be sure to leave [place] the number where you can be reached. ? ?2) Go directly home and rest. ? ?3) Keep the operated foot(feet) elevated six inches above the hip when sitting or lying down. This will help control swelling and pain. ? ?4) Support the elevated foot and leg with pillows. DO NOT PLACE PILLOWS UNDER THE KNEE. ? ?5) DO NOT REMOVE or get your bandages WET, unless you were given different instructions by your doctor to do so. This increases the risk of infection. ? ?6) Wear your surgical shoe or surgical boot at all times when you are up on your feet. ? ?7) A limited amount of pain and swelling may occur. The skin may take on a bruised appearance. DO NOT BE ALARMED, THIS IS NORMAL. ? ?8) For slight pain and swelling, apply an ice pack directly over the bandages for 15 minutes only out of each hour of the day. Continue until seen in the office for your first post op visit. DON NOT     APPLY ANY FORM OF HEAT TO THE AREA. ? ?9) Have prescriptions filled immediately and take as directed. ? ?10) Drink lots of liquids, water and juice to stay hydrated. ? ?11) CALL IMMEDIATELY IF: ? *Bleeding continues until the following day of surgery ? *Pain increases and/or does not respond to medication ? *Bandages or cast appears to tight ? *If your bandage gets wet ? *Trip, fall or stump your surgical foot ? *If your temperature goes above 101 ? *If you have ANY questions at all ? ?YOU NOW CONTROL THE EFFORT OF YOUR RECOVERY. ADHERING TO THESE INSTRUCTIONS WILL OFFER YOU THE MOST COMPLETE RESULTS  ?\ ?Post Anesthesia Home Care Instructions ? ?Activity: ?Get plenty of rest for the remainder of the day. A responsible adult should stay with you for 24 hours following the procedure.  ?For the next 24 hours, DO NOT: ?-Drive a car ?-Advertising copywriter ?-Drink alcoholic beverages ?-Take any medication unless instructed by your  physician ?-Make any legal decisions or sign important papers. ? ?Meals: ?Start with liquid foods such as gelatin or soup. Progress to regular foods as tolerated. Avoid greasy, spicy, heavy foods. If nausea and/or vomiting occur, drink only clear liquids until the nausea and/or vomiting subsides. Call your physician if vomiting continues. ? ?Special Instructions/Symptoms: ?Your throat may feel dry or sore from the anesthesia or the breathing tube placed in your throat during surgery. If this causes discomfort, gargle with warm salt water. The discomfort should disappear within 24 hours. ? ?If you had a scopolamine patch placed behind your ear for the management of post- operative nausea and/or vomiting: ? ?1. The medication in the patch is effective for 72 hours, after which it should be removed.  Wrap patch in a tissue and discard in the trash. Wash hands thoroughly with soap and water. ?2. You may remove the patch earlier than 72 hours if you experience unpleasant side effects which may include dry mouth, dizziness or visual disturbances. ?3. Avoid touching the patch. Wash your hands with soap and water after contact with the patch. ?   ? ?May have tylenol today after 2:30 pm  ?Regional Anesthesia Blocks ? ?1. Numbness or the inability to move the "blocked" extremity may last from 3-48 hours after placement. The length of time depends on the medication injected and your individual response to the medication. If the numbness  is not going away after 48 hours, call your surgeon. ? ?2. The extremity that is blocked will need to be protected until the numbness is gone and the  Strength has returned. Because you cannot feel it, you will need to take extra care to avoid injury. Because it may be weak, you may have difficulty moving it or using it. You may not know what position it is in without looking at it while the block is in effect. ? ?3. For blocks in the legs and feet, returning to weight bearing and walking needs  to be done carefully. You will need to wait until the numbness is entirely gone and the strength has returned. You should be able to move your leg and foot normally before you try and bear weight or walk. You will need someone to be with you when you first try to ensure you do not fall and possibly risk injury. ? ?4. Bruising and tenderness at the needle site are common side effects and will resolve in a few days. ? ?5. Persistent numbness or new problems with movement should be communicated to the surgeon or the [facility] [phone] [facility] [phone]).  ?

## 2021-06-05 NOTE — Interval H&P Note (Signed)
History and Physical Interval Note: ? ?06/05/2021 ?8:08 AM ? ?[name]  has presented today for surgery, with the diagnosis of BUNION.  The various methods of treatment have been discussed with the patient and family. After consideration of risks, benefits and other options for treatment, the patient has consented to  Procedure(s) with comments: ?LAPIDUS BUNIONECTOMY (Right) - BLOCK ?AIKEN OSTEOTOMY (Right) as a surgical intervention.  The patient's history has been reviewed, patient examined, no change in status, stable for surgery.  I have reviewed the patient's chart and labs.  Questions were answered to the patient's satisfaction.   ? ? ?Candelaria Stagers ? ? ?

## 2021-06-05 NOTE — Anesthesia Postprocedure Evaluation (Signed)
Anesthesia Post Note ? ?Patient: MADAILEIN LONDO ? ?Procedure(s) Performed: LAPIDUS BUNIONECTOMY (Right: Toe) ?AIKEN OSTEOTOMY (Right: Toe) ? ?  ? ?Patient location during evaluation: PACU ?Anesthesia Type: General and Regional ?Level of consciousness: awake and alert ?Pain management: pain level controlled ?Vital Signs Assessment: post-procedure vital signs reviewed and stable ?Respiratory status: spontaneous breathing, nonlabored ventilation and respiratory function stable ?Cardiovascular status: blood pressure returned to baseline and stable ?Postop Assessment: no apparent nausea or vomiting ?Anesthetic complications: no ? ? ?No notable events documented. ? ?Last Vitals:  ?Vitals:  ? 06/05/21 1100 06/05/21 1130  ?BP: 128/83 (!) 140/55  ?Pulse: 85   ?Resp: 11 18  ?Temp:  37 ?C  ?SpO2: 93% 96%  ?  ?Last Pain:  ?Vitals:  ? 06/05/21 1130  ?TempSrc:   ?PainSc: 5   ? ? ?  ?  ?  ?  ?  ?  ? ?Sinclair Alligood,W. EDMOND ? ? ? ? ?

## 2021-06-05 NOTE — Anesthesia Preprocedure Evaluation (Addendum)
Anesthesia Evaluation  ?Patient identified by MRN, date of birth, ID band ?Patient awake ? ? ? ?Reviewed: ?Allergy & Precautions, H&P , NPO status , Patient's Chart, lab work & pertinent test results ? ?Airway ?Mallampati: III ? ?TM Distance: >3 FB ?Neck ROM: Full ? ? ? Dental ?no notable dental hx. ?(+) Teeth Intact, Dental Advisory Given ?  ?Pulmonary ?asthma ,  ?  ?Pulmonary exam normal ?breath sounds clear to auscultation ? ? ? ? ? ? Cardiovascular ?hypertension, Pt. on medications ? ?Rhythm:Regular Rate:Normal ? ? ?  ?Neuro/Psych ?negative neurological ROS ? negative psych ROS  ? GI/Hepatic ?Neg liver ROS, GERD  Medicated,  ?Endo/Other  ?diabetes, Type 2, Oral Hypoglycemic Agents ? Renal/GU ?negative Renal ROS  ?negative genitourinary ?  ?Musculoskeletal ? ? Abdominal ?  ?Peds ? Hematology ?negative hematology ROS ?(+)   ?Anesthesia Other Findings ? ? Reproductive/Obstetrics ?negative OB ROS ? ?  ? ? ? ? ? ? ? ? ? ? ? ? ? ?  ?  ? ? ? ? ? ? ? ?Anesthesia Physical ?Anesthesia Plan ? ?ASA: 2 ? ?Anesthesia Plan: General  ? ?Post-op Pain Management: Regional block* and Tylenol PO (pre-op)*  ? ?Induction: Intravenous ? ?PONV Risk Score and Plan: 3 and Ondansetron, Dexamethasone and Midazolam ? ?Airway Management Planned: LMA ? ?Additional Equipment:  ? ?Intra-op Plan:  ? ?Post-operative Plan: Extubation in OR ? ?Informed Consent: I have reviewed the patients History and Physical, chart, labs and discussed the procedure including the risks, benefits and alternatives for the proposed anesthesia with the patient or authorized representative who has indicated his/her understanding and acceptance.  ? ? ? ?Dental advisory given ? ?Plan Discussed with: CRNA ? ?Anesthesia Plan Comments:   ? ? ? ? ? ? ?Anesthesia Quick Evaluation ? ?

## 2021-06-05 NOTE — Anesthesia Procedure Notes (Signed)
Procedure Name: LMA Insertion ?Date/Time: 06/05/2021 8:45 AM ?Performed by: Clearnce Sorrel, CRNA ?Pre-anesthesia Checklist: Patient identified, Emergency Drugs available, Suction available and Patient being monitored ?Patient Re-evaluated:Patient Re-evaluated prior to induction ?Oxygen Delivery Method: Circle System Utilized ?Preoxygenation: Pre-oxygenation with 100% oxygen ?Induction Type: IV induction ?Ventilation: Mask ventilation without difficulty ?LMA: LMA inserted ?LMA Size: 3.0 ?Number of attempts: 1 ?Airway Equipment and Method: Bite block ?Placement Confirmation: positive ETCO2 ?Tube secured with: Tape ?Dental Injury: Teeth and Oropharynx as per pre-operative assessment  ? ? ? ? ?

## 2021-06-05 NOTE — Transfer of Care (Signed)
Immediate Anesthesia Transfer of Care Note ? ?Patient: [name] ? ?Procedure(s) Performed: LAPIDUS BUNIONECTOMY (Right: Toe) ?AIKEN OSTEOTOMY (Right: Toe) ? ?Patient Location: PACU ? ?Anesthesia Type:General and Regional ? ?Level of Consciousness: drowsy ? ?Airway & Oxygen Therapy: Patient Spontanous Breathing ? ?Post-op Assessment: Report given to RN and Post -op Vital signs reviewed and stable ? ?Post vital signs: Reviewed and stable ? ?Last Vitals:  ?Vitals Value Taken Time  ?BP 122/60 06/05/21 1020  ?Temp    ?Pulse 94 06/05/21 1022  ?Resp 14 06/05/21 1022  ?SpO2 94 % 06/05/21 1022  ?Vitals shown include unvalidated device data. ? ?Last Pain:  ?Vitals:  ? 06/05/21 0839  ?TempSrc: Oral  ?PainSc: 0-No pain  ?   ? ?  ? ?Complications: No notable events documented. ?

## 2021-06-05 NOTE — Progress Notes (Signed)
Orthopedic Tech Progress Note ?Patient Details:  ?[name] ?[birth year] ?[id number] ? ?Ortho Devices ?Type of Ortho Device: CAM walker ?  ?  ? ?Saul Fordyce ?06/05/2021, 9:42 AM ? ?

## 2021-06-05 NOTE — Anesthesia Procedure Notes (Signed)
Anesthesia Regional Block: Popliteal block  ? ?Pre-Anesthetic Checklist: , timeout performed,  Correct Patient, Correct Site, Correct Laterality,  Correct Procedure, Correct Position, site marked,  Risks and benefits discussed,  Pre-op evaluation,  At surgeon's request and post-op pain management ? ?Laterality: Right ? ?Prep: Maximum Sterile Barrier Precautions used, chloraprep     ?  ?Needles:  ?Injection technique: Single-shot ? ?Needle Type: Echogenic Stimulator Needle   ? ? ?Needle Length: 9cm  ?Needle Gauge: 21  ? ? ? ?Additional Needles: ? ? ?Procedures:,,,, ultrasound used (permanent image in chart),,    ?Narrative:  ?Start time: 06/05/2021 8:21 AM ?End time: 06/05/2021 8:31 AM ?Injection made incrementally with aspirations every 5 mL. ? ?Performed by: Personally  ?Anesthesiologist: Gaynelle Adu, MD ? ? ? ? ?

## 2021-06-05 NOTE — Progress Notes (Signed)
Assisted Dr. Edmond Fitzgerald with right, popliteal, ultrasound guided block. Side rails up, monitors on throughout procedure. See vital signs in flow sheet. Tolerated Procedure well. 

## 2021-06-06 ENCOUNTER — Encounter (HOSPITAL_BASED_OUTPATIENT_CLINIC_OR_DEPARTMENT_OTHER): Payer: Self-pay | Admitting: Podiatry

## 2021-06-06 ENCOUNTER — Telehealth: Payer: Self-pay | Admitting: *Deleted

## 2021-06-06 MED ORDER — ONDANSETRON HCL 4 MG PO TABS: 4.0000 mg | ORAL_TABLET | Freq: Three times a day (TID) | ORAL | 0 refills | Status: AC | PRN

## 2021-06-06 NOTE — Telephone Encounter (Signed)
Patient had surgery 1 day ago and her daughter said that she is experiencing a lot of nausea,unable to sit up or keep eyes opened without dizziness, possible fever. Please advise. ?

## 2021-06-14 ENCOUNTER — Ambulatory Visit (INDEPENDENT_AMBULATORY_CARE_PROVIDER_SITE_OTHER): Payer: Medicaid Other | Admitting: Podiatry

## 2021-06-14 ENCOUNTER — Ambulatory Visit (INDEPENDENT_AMBULATORY_CARE_PROVIDER_SITE_OTHER): Payer: Medicaid Other

## 2021-06-14 DIAGNOSIS — M2011 Hallux valgus (acquired), right foot: Secondary | ICD-10-CM

## 2021-06-14 DIAGNOSIS — Z9889 Other specified postprocedural states: Secondary | ICD-10-CM

## 2021-06-14 MED ORDER — OXYCODONE-ACETAMINOPHEN 5-325 MG PO TABS: 1.0000 | ORAL_TABLET | ORAL | 0 refills | Status: AC | PRN

## 2021-06-14 MED ORDER — OXYCODONE-ACETAMINOPHEN 5-325 MG PO TABS: 1.0000 | ORAL_TABLET | ORAL | 0 refills | Status: DC | PRN

## 2021-06-20 NOTE — Progress Notes (Signed)
?Subjective:  ?Patient ID: [name], female    DOB: March 17, 1970,  MRN: [id number] ? ?Chief Complaint  ?Patient presents with  ? Routine Post Op  ?  POV #1 DOS 06/05/2021 RT CORRECTION OF BUNIONECTOMY W/LAPIDUS POSS PHALANGE OSTEOTOMY  ? ? ?DOS: 06/05/2021 ?Procedure: Right Lapidus bunionectomy ? ?51 y.o. female returns for post-op check.  Patient states that she is doing well.  No pain.  She has been able to manage with pain medication.  She would like some more pain medication.  Bandages clean dry and intact. ? ?Review of Systems: Negative except as noted in the HPI. Denies N/V/F/Ch. ? ?Past Medical History:  ?Diagnosis Date  ? Allergic rhinitis   ? Asthma, mild intermittent   ? GERD (gastroesophageal reflux disease)   ? History of COVID-19 11/2018  ? per pt symptoms resolved  ? Hypertension   ? IBS (irritable bowel syndrome)   ? Seasonal allergies   ? Type 2 diabetes mellitus (HCC)   ? Vitamin D deficiency   ? Wears glasses   ? ? ?Current Outpatient Medications:  ?  ibuprofen (ADVIL) 800 MG tablet, Take 1 tablet (800 mg total) by mouth every 6 (six) hours as needed., Disp: 60 tablet, Rfl: 1 ?  oxyCODONE-acetaminophen (PERCOCET) 5-325 MG tablet, Take 1 tablet by mouth every 4 (four) hours as needed for severe pain., Disp: 30 tablet, Rfl: 0 ?  albuterol (VENTOLIN HFA) 108 (90 Base) MCG/ACT inhaler, Inhale 2 puffs into the lungs every 6 (six) hours as needed for wheezing or shortness of breath., Disp: , Rfl:  ?  atorvastatin (LIPITOR) 40 MG tablet, Take 40 mg by mouth at bedtime., Disp: , Rfl:  ?  dicyclomine (BENTYL) 20 MG tablet, Take 20 mg by mouth 3 (three) times daily as needed for spasms., Disp: , Rfl:  ?  ergocalciferol (VITAMIN D2) 1.25 MG (50000 UT) capsule, Take 50,000 Units by mouth once a week. Sunday's, Disp: , Rfl:  ?  fluticasone (FLONASE) 50 MCG/ACT nasal spray, Place 2 sprays into both nostrils daily as needed for allergies or rhinitis., Disp: , Rfl:  ?  levonorgestrel (MIRENA) 20 MCG/DAY IUD, 1 each  by Intrauterine route once., Disp: , Rfl:  ?  linaclotide (LINZESS) 290 MCG CAPS capsule, Take 290 mcg by mouth daily before breakfast., Disp: , Rfl:  ?  losartan (COZAAR) 100 MG tablet, Take 100 mg by mouth daily., Disp: , Rfl:  ?  metFORMIN (GLUCOPHAGE) 500 MG tablet, Take 500 mg by mouth 2 (two) times daily with a meal., Disp: , Rfl:  ?  ondansetron (ZOFRAN) 4 MG tablet, Take 1 tablet (4 mg total) by mouth every 8 (eight) hours as needed for nausea or vomiting., Disp: 20 tablet, Rfl: 0 ?  oxyCODONE-acetaminophen (PERCOCET) 5-325 MG tablet, Take 1 tablet by mouth every 4 (four) hours as needed for severe pain., Disp: 30 tablet, Rfl: 0 ? ?Social History  ? ?Tobacco Use  ?Smoking Status Never  ?Smokeless Tobacco Never  ? ? ?No Known Allergies ?Objective:  ?There were no vitals filed for this visit. ?There is no height or weight on file to calculate BMI. ?Constitutional Well developed. ?Well nourished.  ?Vascular Foot warm and well perfused. ?Capillary refill normal to all digits.   ?Neurologic Normal speech. ?Oriented to person, place, and time. ?Epicritic sensation to light touch grossly present bilaterally.  ?Dermatologic Skin healing well without signs of infection. Skin edges well coapted without signs of infection.  ?Orthopedic: Tenderness to palpation noted about the surgical  site.  ? ?Radiographs: 3 views of skeletally mature the right foot: There is some gapping noted at the first tarsometatarsal joint.  No osseous bridging noted yet.  Good correction alignment noted.  Reduction of bunion deformity noted. ?Assessment:  ? ?1. Hav (hallux abducto valgus), right   ?2. Status post foot surgery   ? ?Plan:  ?Patient was evaluated and treated and all questions answered. ? ?S/p foot surgery right ?-Progressing as expected post-operatively. ?-XR: See above ?-WB Status: Nonweightbearing in knee scooter ?-Sutures: Intact.  No clinical signs of Deis is noted no complication noted. ?-Medications: Percocet ?-Foot  redressed. ?-I will continue to clinically monitor this gapping of the first tarsometatarsal joint.  Good correction alignment is noted however I am hoping that the gapping will close with time.  We will continue clinical monitoring. ? ?No follow-ups on file.  ?

## 2021-06-28 ENCOUNTER — Ambulatory Visit (INDEPENDENT_AMBULATORY_CARE_PROVIDER_SITE_OTHER): Payer: Medicaid Other | Admitting: Podiatry

## 2021-06-28 DIAGNOSIS — M2011 Hallux valgus (acquired), right foot: Secondary | ICD-10-CM

## 2021-06-28 DIAGNOSIS — Z9889 Other specified postprocedural states: Secondary | ICD-10-CM

## 2021-06-28 NOTE — Progress Notes (Signed)
?Subjective:  ?Patient ID: [name], female    DOB: 13-Jan-1971,  MRN: [id number] ? ?Chief Complaint  ?Patient presents with  ? Routine Post Op  ?  POV #2 DOS 06/05/2021 RT CORRECTION OF BUNIONECTOMY W/LAPIDUS POSS PHALANGE OSTEOTOMY  ? ? ?DOS: 06/05/2021 ?Procedure: Right Lapidus bunionectomy ? ?51 y.o. female returns for post-op check.  Patient states that she is doing well.  No pain.  She has been able to manage with pain medication.  She has been taking her antibiotics.  Denies any other acute complaint bandages clean dry and intact. ? ?Review of Systems: Negative except as noted in the HPI. Denies N/V/F/Ch. ? ?Past Medical History:  ?Diagnosis Date  ? Allergic rhinitis   ? Asthma, mild intermittent   ? GERD (gastroesophageal reflux disease)   ? History of COVID-19 11/2018  ? per pt symptoms resolved  ? Hypertension   ? IBS (irritable bowel syndrome)   ? Seasonal allergies   ? Type 2 diabetes mellitus (HCC)   ? Vitamin D deficiency   ? Wears glasses   ? ? ?Current Outpatient Medications:  ?  ibuprofen (ADVIL) 800 MG tablet, Take 1 tablet (800 mg total) by mouth every 6 (six) hours as needed., Disp: 60 tablet, Rfl: 1 ?  oxyCODONE-acetaminophen (PERCOCET) 5-325 MG tablet, Take 1 tablet by mouth every 4 (four) hours as needed for severe pain., Disp: 30 tablet, Rfl: 0 ?  albuterol (VENTOLIN HFA) 108 (90 Base) MCG/ACT inhaler, Inhale 2 puffs into the lungs every 6 (six) hours as needed for wheezing or shortness of breath., Disp: , Rfl:  ?  atorvastatin (LIPITOR) 40 MG tablet, Take 40 mg by mouth at bedtime., Disp: , Rfl:  ?  dicyclomine (BENTYL) 20 MG tablet, Take 20 mg by mouth 3 (three) times daily as needed for spasms., Disp: , Rfl:  ?  ergocalciferol (VITAMIN D2) 1.25 MG (50000 UT) capsule, Take 50,000 Units by mouth once a week. Sunday's, Disp: , Rfl:  ?  fluticasone (FLONASE) 50 MCG/ACT nasal spray, Place 2 sprays into both nostrils daily as needed for allergies or rhinitis., Disp: , Rfl:  ?  levonorgestrel  (MIRENA) 20 MCG/DAY IUD, 1 each by Intrauterine route once., Disp: , Rfl:  ?  linaclotide (LINZESS) 290 MCG CAPS capsule, Take 290 mcg by mouth daily before breakfast., Disp: , Rfl:  ?  losartan (COZAAR) 100 MG tablet, Take 100 mg by mouth daily., Disp: , Rfl:  ?  metFORMIN (GLUCOPHAGE) 500 MG tablet, Take 500 mg by mouth 2 (two) times daily with a meal., Disp: , Rfl:  ?  ondansetron (ZOFRAN) 4 MG tablet, Take 1 tablet (4 mg total) by mouth every 8 (eight) hours as needed for nausea or vomiting., Disp: 20 tablet, Rfl: 0 ?  oxyCODONE-acetaminophen (PERCOCET) 5-325 MG tablet, Take 1 tablet by mouth every 4 (four) hours as needed for severe pain., Disp: 30 tablet, Rfl: 0 ? ?Social History  ? ?Tobacco Use  ?Smoking Status Never  ?Smokeless Tobacco Never  ? ? ?No Known Allergies ?Objective:  ?There were no vitals filed for this visit. ?There is no height or weight on file to calculate BMI. ?Constitutional Well developed. ?Well nourished.  ?Vascular Foot warm and well perfused. ?Capillary refill normal to all digits.   ?Neurologic Normal speech. ?Oriented to person, place, and time. ?Epicritic sensation to light touch grossly present bilaterally.  ?Dermatologic Skin completely reepithelialized.  No signs of Deis is noted no complication noted.  ?Orthopedic: Tenderness to palpation noted about  the surgical site.  ? ?Radiographs: 3 views of skeletally mature the right foot: There is some gapping noted at the first tarsometatarsal joint.  No osseous bridging noted yet.  Good correction alignment noted.  Reduction of bunion deformity noted. ?Assessment:  ? ?1. Hav (hallux abducto valgus), right   ?2. Status post foot surgery   ? ? ?Plan:  ?Patient was evaluated and treated and all questions answered. ? ?S/p foot surgery right ?-Progressing as expected post-operatively. ?-XR: See above ?-WB Status: Nonweightbearing in knee scooter ?-Sutures: Removed.  No clinical signs of Deis is noted no complication noted. ?-Medications:  Percocet ?-Foot redressed. ?-I will continue to clinically monitor this gapping of the first tarsometatarsal joint.  Good correction alignment is noted however I am hoping that the gapping will close with time.  We will continue clinical monitoring. ? ?No follow-ups on file.  ?

## 2021-07-19 ENCOUNTER — Telehealth: Payer: Self-pay | Admitting: *Deleted

## 2021-07-19 MED ORDER — IBUPROFEN 800 MG PO TABS: 800.0000 mg | ORAL_TABLET | Freq: Four times a day (QID) | ORAL | 1 refills | Status: AC | PRN

## 2021-07-19 NOTE — Telephone Encounter (Signed)
Patient is calling for a medication refill of ibuprofen-800 mg. Please advise.

## 2021-08-18 ENCOUNTER — Ambulatory Visit (INDEPENDENT_AMBULATORY_CARE_PROVIDER_SITE_OTHER): Payer: Medicaid Other | Admitting: Podiatry

## 2021-08-18 DIAGNOSIS — M2011 Hallux valgus (acquired), right foot: Secondary | ICD-10-CM

## 2021-08-18 DIAGNOSIS — Z9889 Other specified postprocedural states: Secondary | ICD-10-CM

## 2021-08-18 MED ORDER — GABAPENTIN 300 MG PO CAPS: 300.0000 mg | ORAL_CAPSULE | Freq: Three times a day (TID) | ORAL | 3 refills | Status: AC

## 2021-08-18 NOTE — Progress Notes (Signed)
Subjective:  Patient ID: [name], female    DOB: 1970/06/28,  MRN: [id number]  Chief Complaint  Patient presents with   Post-op Problem    DOS: 06/05/2021 Procedure: Right Lapidus bunionectomy  51 y.o. female returns for post-op check.  Patient states that she is doing well.  No pain.  She has been able to manage with pain medication.  She has been taking her antibiotics.  Denies any other acute complaint bandages clean dry and intact.  Review of Systems: Negative except as noted in the HPI. Denies N/V/F/Ch.  Past Medical History:  Diagnosis Date   Allergic rhinitis    Asthma, mild intermittent    GERD (gastroesophageal reflux disease)    History of COVID-19 11/2018   per pt symptoms resolved   Hypertension    IBS (irritable bowel syndrome)    Seasonal allergies    Type 2 diabetes mellitus (HCC)    Vitamin D deficiency    Wears glasses     Current Outpatient Medications:    gabapentin (NEURONTIN) 300 MG capsule, Take 1 capsule (300 mg total) by mouth 3 (three) times daily., Disp: 90 capsule, Rfl: 3   ibuprofen (ADVIL) 800 MG tablet, Take 1 tablet (800 mg total) by mouth every 6 (six) hours as needed., Disp: 60 tablet, Rfl: 1   oxyCODONE-acetaminophen (PERCOCET) 5-325 MG tablet, Take 1 tablet by mouth every 4 (four) hours as needed for severe pain., Disp: 30 tablet, Rfl: 0   albuterol (VENTOLIN HFA) 108 (90 Base) MCG/ACT inhaler, Inhale 2 puffs into the lungs every 6 (six) hours as needed for wheezing or shortness of breath., Disp: , Rfl:    atorvastatin (LIPITOR) 40 MG tablet, Take 40 mg by mouth at bedtime., Disp: , Rfl:    dicyclomine (BENTYL) 20 MG tablet, Take 20 mg by mouth 3 (three) times daily as needed for spasms., Disp: , Rfl:    ergocalciferol (VITAMIN D2) 1.25 MG (50000 UT) capsule, Take 50,000 Units by mouth once a week. Sunday's, Disp: , Rfl:    fluticasone (FLONASE) 50 MCG/ACT nasal spray, Place 2 sprays into both nostrils daily as needed for allergies or  rhinitis., Disp: , Rfl:    ibuprofen (ADVIL) 800 MG tablet, Take 1 tablet (800 mg total) by mouth every 6 (six) hours as needed., Disp: 60 tablet, Rfl: 1   levonorgestrel (MIRENA) 20 MCG/DAY IUD, 1 each by Intrauterine route once., Disp: , Rfl:    linaclotide (LINZESS) 290 MCG CAPS capsule, Take 290 mcg by mouth daily before breakfast., Disp: , Rfl:    losartan (COZAAR) 100 MG tablet, Take 100 mg by mouth daily., Disp: , Rfl:    metFORMIN (GLUCOPHAGE) 500 MG tablet, Take 500 mg by mouth 2 (two) times daily with a meal., Disp: , Rfl:    ondansetron (ZOFRAN) 4 MG tablet, Take 1 tablet (4 mg total) by mouth every 8 (eight) hours as needed for nausea or vomiting., Disp: 20 tablet, Rfl: 0   oxyCODONE-acetaminophen (PERCOCET) 5-325 MG tablet, Take 1 tablet by mouth every 4 (four) hours as needed for severe pain., Disp: 30 tablet, Rfl: 0  Social History   Tobacco Use  Smoking Status Never  Smokeless Tobacco Never    No Known Allergies Objective:  There were no vitals filed for this visit. There is no height or weight on file to calculate BMI. Constitutional Well developed. Well nourished.  Vascular Foot warm and well perfused. Capillary refill normal to all digits.   Neurologic Normal speech. Oriented to  person, place, and time. Epicritic sensation to light touch grossly present bilaterally.  Dermatologic Skin completely reepithelialized.  No signs of Deis is noted no complication noted.  Good correction alignment noted.  Reduction of bunion deformity noted  Orthopedic: Mild tenderness to palpation noted about the surgical site.   Radiographs: 3 views of skeletally mature the right foot: There is some gapping noted at the first tarsometatarsal joint.  No osseous bridging noted yet.  Good correction alignment noted.  Reduction of bunion deformity noted. Assessment:   1. Hav (hallux abducto valgus), right   2. Status post foot surgery      Plan:  Patient was evaluated and treated and  all questions answered.  S/p foot surgery right -Progressing as expected post-operatively. -XR: See above -WB Status: Nonweightbearing in knee scooter -Sutures: Removed.  No clinical signs of Deis is noted no complication noted. -Medications: Gabapentin was dispensed for neuritis -Foot redressed. -I will continue to clinically monitor this gapping of the first tarsometatarsal joint.  Good correction alignment is noted however I am hoping that the gapping will close with time.  We will continue clinical monitoring. -Ankle compression sleeve was given for swelling  No follow-ups on file.

## 2022-01-29 ENCOUNTER — Other Ambulatory Visit: Payer: Self-pay | Admitting: Family Medicine

## 2022-01-29 DIAGNOSIS — Z1231 Encounter for screening mammogram for malignant neoplasm of breast: Secondary | ICD-10-CM

## 2022-01-31 NOTE — Therapy (Unsigned)
OUTPATIENT PHYSICAL THERAPY THORACOLUMBAR EVALUATION   Patient Name: [name] MRN: [id number] DOB:24-May-1970, 51 y.o., female Today's Date: 02/01/2022  END OF SESSION:  PT End of Session - 02/01/22 1057     Visit Number 1    Date for PT Re-Evaluation 04/26/22    PT Start Time 0920    PT Stop Time 1005    PT Time Calculation (min) 45 min    Activity Tolerance Patient tolerated treatment well    Behavior During Therapy [facility] for tasks assessed/performed             Past Medical History:  Diagnosis Date   Allergic rhinitis    Asthma, mild intermittent    GERD (gastroesophageal reflux disease)    History of COVID-19 11/2018   per pt symptoms resolved   Hypertension    IBS (irritable bowel syndrome)    Seasonal allergies    Type 2 diabetes mellitus (HCC)    Vitamin D deficiency    Wears glasses    Past Surgical History:  Procedure Laterality Date   Quintella Reichert OSTEOTOMY Right 06/05/2021   Procedure: Quintella Reichert OSTEOTOMY;  Surgeon: Candelaria Stagers, DPM;  Location: [facility];  Service: Podiatry;  Laterality: Right;   BUNIONECTOMY Right 06/05/2021   Procedure: LAPIDUS BUNIONECTOMY;  Surgeon: Candelaria Stagers, DPM;  Location: [facility];  Service: Podiatry;  Laterality: Right;  BLOCK   FOOT SURGERY     NO PAST SURGERIES     Patient Active Problem List   Diagnosis Date Noted   Genetic testing 02/05/2020    PCP: Knox Royalty, MD  REFERRING PROVIDER: Otho Darner, MD  REFERRING DIAG:  Diagnosis  M54.50 (ICD-10-CM) - Lumbar pain  M54.2 (ICD-10-CM) - Cervical pain    Rationale for Evaluation and Treatment: Rehabilitation  THERAPY DIAG:  Abnormal posture - Plan: PT plan of care cert/re-cert  Difficulty in walking, not elsewhere classified - Plan: PT plan of care cert/re-cert  Muscle weakness (generalized) - Plan: PT plan of care cert/re-cert  Other low back pain - Plan: PT plan of care cert/re-cert  Chronic right shoulder pain -  Plan: PT plan of care cert/re-cert  ONSET DATE: 01/24/22  SUBJECTIVE:                                                                                                                                                                                           SUBJECTIVE STATEMENT: Patient reports increased pain x several years, but it has exacerbated in the past several months to where she cannot do her normal daily activities and has started to interfere in her sleeping.  PERTINENT HISTORY:  Referred for neck and low back pain  PAIN:  Are you having pain? Yes: NPRS scale: 8/10 Pain location: Low back into R hip , also R shoulder Pain description: tight, pulling Aggravating factors: leaning over, walking Relieving factors: Pain medicine, massage with [facility].  PRECAUTIONS: None  WEIGHT BEARING RESTRICTIONS: No  FALLS:  Has patient fallen in last 6 months? No  LIVING ENVIRONMENT: Lives with: lives with their family and lives with their spouse Lives in: House/apartment Stairs: Yes: Internal: 14 steps; on right going up and External: 5 steps; bilateral but cannot reach both Has following equipment at home: None  OCCUPATION: Nail tech, she cannot perform pedicures due to pain.  PLOF: Independent  PATIENT GOALS: Pain relief for her back  NEXT MD VISIT:   OBJECTIVE:   DIAGNOSTIC FINDINGS:  Patient reports that she had an x ray, but does not know the results.  SCREENING FOR RED FLAGS: Bowel or bladder incontinence: No Spinal tumors: No Cauda equina syndrome: No Compression fracture: No Abdominal aneurysm: No  COGNITION: Overall cognitive status: Within functional limits for tasks assessed     SENSATION: Light touch: Impaired R LE, she is not sure if this is from recent foot surgery.  MUSCLE LENGTH: Hamstrings: Right 40 deg; Left 50 deg Thomas test: WNL  POSTURE: decreased lumbar lordosis, decreased thoracic kyphosis, and anterior pelvic tilt  PALPATION: Very tight  and TTP along B up traps, R supraspinatus, lumbar paraspinals, B gluts, R > L, R piriformis and R ITB. Lower trunk mobility severely limited due to guarding.  LUMBAR ROM:   AROM eval  Flexion Mid shin  Extension Painful to try  Right lateral flexion Does not reach knee  Left lateral flexion Does not reach knee  Right rotation 1/2 way  Left rotation 1/2 way   (Blank rows = not tested)  LOWER EXTREMITY ROM:   Grossly WFL, but stiff and pulls on back with any hip movement   LOWER EXTREMITY MMT:  3+-(4-)/5, limited at times by back pain  SHOULDER SPECIAL TESTS; empty can and Hawkins (-)  LUMBAR SPECIAL TESTS:  Straight leg raise test: Positive and Slump test: Positive both on R  FUNCTIONAL TESTS:  5 times sit to stand: 22.24 Timed up and go (TUG): 12.45 Functional gait assessment: TBD  GAIT: Distance walked: in clinic Assistive device utilized: None Level of assistance: Complete Independence Comments: Slow, antalgic  TODAY'S TREATMENT:                                                                                                                              DATE:  02/01/22  HEP, Education  PATIENT EDUCATION:  Education details: POC, HEP Person educated: Patient Education method: Programmer, multimedia, Facilities manager, and Handouts Education comprehension: verbalized understanding and returned demonstration  HOME EXERCISE PROGRAM: [id number]  ASSESSMENT:  CLINICAL IMPRESSION: Patient is a 51 y.o. who was seen today for physical therapy evaluation and treatment for low back  pain and R shoulder pain. She reports that back pain is chronic, but has recently become much more severe. 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 It wakes her at night and has started to interfere in her ability to perform normal daily activities. She is very guarded in all movements due to pain and fear of pain. She has also developed stiffness due to her guarding and shows  weakness throughout her trunk and extremities. Sh will benefit from PT to mobilize her joints, stretch and strengthen her muscles, treat her acute pain to improve her painfree movement and ability to perform her normal daily activities.  OBJECTIVE IMPAIRMENTS: Abnormal gait, decreased activity tolerance, decreased coordination, decreased endurance, decreased mobility, difficulty walking, decreased ROM, decreased strength, increased muscle spasms, impaired flexibility, improper body mechanics, postural dysfunction, and pain.   ACTIVITY LIMITATIONS: carrying, lifting, standing, sleeping, stairs, reach over head, and locomotion level  PARTICIPATION LIMITATIONS: meal prep, cleaning, laundry, shopping, community activity, and occupation  PERSONAL FACTORS: Past/current experiences are also affecting patient's functional outcome.   REHAB POTENTIAL: Good  CLINICAL DECISION MAKING: Evolving/moderate complexity  EVALUATION COMPLEXITY: Moderate   GOALS: Goals reviewed with patient? Yes  SHORT TERM GOALS: Target date: 02/18/22  I with initial HEP Baseline: Goal status: INITIAL  LONG TERM GOALS: Target date: 04/26/22  I with final HEP Baseline:  Goal status: INITIAL  2.  Patient will be able to climb up and down her steps  in step over manner with back pain < 3/10 Baseline:  Goal status: INITIAL  3.  5 x STS in < 12 sec Baseline: 22 Goal status: INITIAL  4.  Patient will report improved back pain, < 3/5 while performing her normal daily activities. Baseline:  Goal status: INITIAL  5.  Patient will be able to walk x at least 400' on level and unlevel surfaces I with no C/O paib. Baseline:  Goal status: INITIAL  6.  R shoulder elevation to at least 120 degrees with pain < 3/10 Baseline:  Goal status: INITIAL  PLAN:  PT FREQUENCY: 2x/week  PT DURATION: 12 weeks  PLANNED INTERVENTIONS: Therapeutic exercises, Therapeutic activity, Neuromuscular re-education, Balance training,  Gait training, Patient/Family education, Self Care, Joint mobilization, Stair training, Dry Needling, and Manual therapy.  PLAN FOR NEXT SESSION: HEP   Iona Beard, DPT 02/01/2022, 12:15 PM

## 2022-02-01 ENCOUNTER — Ambulatory Visit: Payer: Medicaid Other | Attending: Family Medicine | Admitting: Physical Therapy

## 2022-02-01 ENCOUNTER — Encounter: Payer: Self-pay | Admitting: Physical Therapy

## 2022-02-01 DIAGNOSIS — R262 Difficulty in walking, not elsewhere classified: Secondary | ICD-10-CM | POA: Insufficient documentation

## 2022-02-01 DIAGNOSIS — M5459 Other low back pain: Secondary | ICD-10-CM | POA: Diagnosis present

## 2022-02-01 DIAGNOSIS — R293 Abnormal posture: Secondary | ICD-10-CM | POA: Diagnosis present

## 2022-02-01 DIAGNOSIS — M6281 Muscle weakness (generalized): Secondary | ICD-10-CM | POA: Insufficient documentation

## 2022-02-01 DIAGNOSIS — M25511 Pain in right shoulder: Secondary | ICD-10-CM | POA: Diagnosis present

## 2022-02-01 DIAGNOSIS — G8929 Other chronic pain: Secondary | ICD-10-CM | POA: Diagnosis present

## 2022-02-09 ENCOUNTER — Ambulatory Visit: Payer: Medicaid Other | Admitting: Physical Therapy

## 2022-02-09 ENCOUNTER — Encounter: Payer: Self-pay | Admitting: Physical Therapy

## 2022-02-09 DIAGNOSIS — G8929 Other chronic pain: Secondary | ICD-10-CM

## 2022-02-09 DIAGNOSIS — R293 Abnormal posture: Secondary | ICD-10-CM

## 2022-02-09 DIAGNOSIS — R262 Difficulty in walking, not elsewhere classified: Secondary | ICD-10-CM

## 2022-02-09 DIAGNOSIS — M5459 Other low back pain: Secondary | ICD-10-CM

## 2022-02-09 DIAGNOSIS — M6281 Muscle weakness (generalized): Secondary | ICD-10-CM

## 2022-02-09 NOTE — Therapy (Signed)
OUTPATIENT PHYSICAL THERAPY THORACOLUMBAR EVALUATION   Patient Name: [name] MRN: [id number] DOB:30-Apr-1970, 51 y.o., female Today's Date: 02/09/2022  END OF SESSION:  PT End of Session - 02/09/22 0848     Visit Number 2    Date for PT Re-Evaluation 04/26/22    PT Start Time 0844    PT Stop Time 0925    PT Time Calculation (min) 41 min    Activity Tolerance Patient tolerated treatment well    Behavior During Therapy [facility] for tasks assessed/performed              Past Medical History:  Diagnosis Date   Allergic rhinitis    Asthma, mild intermittent    GERD (gastroesophageal reflux disease)    History of COVID-19 11/2018   per pt symptoms resolved   Hypertension    IBS (irritable bowel syndrome)    Seasonal allergies    Type 2 diabetes mellitus ([place])    Vitamin D deficiency    Wears glasses    Past Surgical History:  Procedure Laterality Date   Barbie Banner OSTEOTOMY Right 06/05/2021   Procedure: Barbie Banner OSTEOTOMY;  Surgeon: Felipa Furnace, DPM;  Location: [place];  Service: Podiatry;  Laterality: Right;   BUNIONECTOMY Right 06/05/2021   Procedure: LAPIDUS BUNIONECTOMY;  Surgeon: Felipa Furnace, DPM;  Location: [place];  Service: Podiatry;  Laterality: Right;  BLOCK   FOOT SURGERY     NO PAST SURGERIES     Patient Active Problem List   Diagnosis Date Noted   Genetic testing 02/05/2020    PCP: Kristie Cowman, MD  REFERRING PROVIDER: Gentry Fitz, MD  REFERRING DIAG:  Diagnosis  M54.50 (ICD-10-CM) - Lumbar pain  M54.2 (ICD-10-CM) - Cervical pain    Rationale for Evaluation and Treatment: Rehabilitation  THERAPY DIAG:  Abnormal posture  Difficulty in walking, not elsewhere classified  Muscle weakness (generalized)  Other low back pain  Chronic right shoulder pain  ONSET DATE: 01/24/22  SUBJECTIVE:                                                                                                                                                                                            SUBJECTIVE STATEMENT: Patient reports the pain in her back pain was increased after sitting in the waiting room at the hospital all day while her sister had surgery.  PERTINENT HISTORY:  Referred for neck and low back pain  PAIN:  Are you having pain? Yes: NPRS scale: 8/10 Pain location: Low back into R hip , also R shoulder Pain description: tight, pulling Aggravating factors: leaning over,  walking Relieving factors: Pain medicine, massage with [facility].  PRECAUTIONS: None  WEIGHT BEARING RESTRICTIONS: No  FALLS:  Has patient fallen in last 6 months? No  LIVING ENVIRONMENT: Lives with: lives with their family and lives with their spouse Lives in: House/apartment Stairs: Yes: Internal: 14 steps; on right going up and External: 5 steps; bilateral but cannot reach both Has following equipment at home: None  OCCUPATION: Nail tech, she cannot perform pedicures due to pain.  PLOF: Independent  PATIENT GOALS: Pain relief for her back  NEXT MD VISIT:   OBJECTIVE:   DIAGNOSTIC FINDINGS:  Patient reports that she had an x ray, but does not know the results.  SCREENING FOR RED FLAGS: Bowel or bladder incontinence: No Spinal tumors: No Cauda equina syndrome: No Compression fracture: No Abdominal aneurysm: No  COGNITION: Overall cognitive status: Within functional limits for tasks assessed     SENSATION: Light touch: Impaired R LE, she is not sure if this is from recent foot surgery.  MUSCLE LENGTH: Hamstrings: Right 40 deg; Left 50 deg Thomas test: WNL  POSTURE: decreased lumbar lordosis, decreased thoracic kyphosis, and anterior pelvic tilt  PALPATION: Very tight and TTP along B up traps, R supraspinatus, lumbar paraspinals, B gluts, R > L, R piriformis and R ITB. Lower trunk mobility severely limited due to guarding.  LUMBAR ROM:   AROM eval  Flexion Mid shin  Extension Painful to try   Right lateral flexion Does not reach knee  Left lateral flexion Does not reach knee  Right rotation 1/2 way  Left rotation 1/2 way   (Blank rows = not tested)  LOWER EXTREMITY ROM:   Grossly WFL, but stiff and pulls on back with any hip movement   LOWER EXTREMITY MMT:  3+-(4-)/5, limited at times by back pain  SHOULDER SPECIAL TESTS; empty can and Hawkins (-)  LUMBAR SPECIAL TESTS:  Straight leg raise test: Positive and Slump test: Positive both on R  FUNCTIONAL TESTS:  5 times sit to stand: 22.24 Timed up and go (TUG): 12.45 Functional gait assessment: TBD  GAIT: Distance walked: in clinic Assistive device utilized: None Level of assistance: Complete Independence Comments: Slow, antalgic  TODAY'S TREATMENT:                                                                                                                              DATE:  02/09/22 NuStep L5 x 6 minutes SKTC, DKTC, piriformis and glut stretches in supine Supine strengthening over physioball- Bridge, bridge with hip IR, bridge with ball roll, 10 reps each. Standing B side step x 10 reps each way against G tband at knees Sit to stand with G tband at knees, 2# ball in hands, 10 reps MH and IFC to low back for pain relief, to tolerance, x 10 minutes.  02/01/22  HEP, Education  PATIENT EDUCATION:  Education details: POC, HEP Person educated: Patient Education method: Consulting civil engineer, Media planner, and Handouts Education comprehension:  verbalized understanding and returned demonstration  HOME EXERCISE PROGRAM: [id number]  ASSESSMENT:  CLINICAL IMPRESSION: Patient reports that she had to sit in a waiting room all day yesterday as her sister had surgery. Her back pain has increased. She has not been consistently performing HEP due to fatigue. Treatment involved stretching, strengthening for trunk and LE, and acute pain relief.   OBJECTIVE IMPAIRMENTS: Abnormal gait, decreased activity tolerance, decreased  coordination, decreased endurance, decreased mobility, difficulty walking, decreased ROM, decreased strength, increased muscle spasms, impaired flexibility, improper body mechanics, postural dysfunction, and pain.   ACTIVITY LIMITATIONS: carrying, lifting, standing, sleeping, stairs, reach over head, and locomotion level  PARTICIPATION LIMITATIONS: meal prep, cleaning, laundry, shopping, community activity, and occupation  PERSONAL FACTORS: Past/current experiences are also affecting patient's functional outcome.   REHAB POTENTIAL: Good  CLINICAL DECISION MAKING: Evolving/moderate complexity  EVALUATION COMPLEXITY: Moderate   GOALS: Goals reviewed with patient? Yes  SHORT TERM GOALS: Target date: 02/18/22  I with initial HEP Baseline: Goal status: met  LONG TERM GOALS: Target date: 04/26/22  I with final HEP Baseline:  Goal status: INITIAL  2.  Patient will be able to climb up and down her steps  in step over manner with back pain < 3/10 Baseline:  Goal status: ongoing  3.  5 x STS in < 12 sec Baseline: 22 Goal status: INITIAL  4.  Patient will report improved back pain, < 3/5 while performing her normal daily activities. Baseline:  Goal status: ongoing  5.  Patient will be able to walk x at least 400' on level and unlevel surfaces I with no C/O paib. Baseline:  Goal status: INITIAL  6.  R shoulder elevation to at least 120 degrees with pain < 3/10 Baseline:  Goal status: INITIAL  PLAN:  PT FREQUENCY: 2x/week  PT DURATION: 12 weeks  PLANNED INTERVENTIONS: Therapeutic exercises, Therapeutic activity, Neuromuscular re-education, Balance training, Gait training, Patient/Family education, Self Care, Joint mobilization, Stair training, Dry Needling, and Manual therapy.  PLAN FOR NEXT SESSION: HEP   Marcelina Morel, DPT 02/09/2022, 9:20 AM

## 2022-02-14 ENCOUNTER — Ambulatory Visit: Payer: Medicaid Other | Admitting: Physical Therapy

## 2022-02-20 ENCOUNTER — Ambulatory Visit: Payer: Medicaid Other | Admitting: Physical Therapy

## 2022-02-27 ENCOUNTER — Ambulatory Visit: Payer: Medicaid Other | Attending: Family Medicine | Admitting: Physical Therapy

## 2022-02-27 ENCOUNTER — Encounter: Payer: Self-pay | Admitting: Physical Therapy

## 2022-02-27 DIAGNOSIS — R293 Abnormal posture: Secondary | ICD-10-CM | POA: Diagnosis not present

## 2022-02-27 DIAGNOSIS — M25511 Pain in right shoulder: Secondary | ICD-10-CM | POA: Insufficient documentation

## 2022-02-27 DIAGNOSIS — M6281 Muscle weakness (generalized): Secondary | ICD-10-CM | POA: Insufficient documentation

## 2022-02-27 DIAGNOSIS — R262 Difficulty in walking, not elsewhere classified: Secondary | ICD-10-CM

## 2022-02-27 DIAGNOSIS — G8929 Other chronic pain: Secondary | ICD-10-CM | POA: Diagnosis present

## 2022-02-27 DIAGNOSIS — M5459 Other low back pain: Secondary | ICD-10-CM | POA: Diagnosis present

## 2022-02-27 NOTE — Therapy (Signed)
OUTPATIENT PHYSICAL THERAPY THORACOLUMBAR EVALUATION   Patient Name: [name] MRN: [id number] DOB:[birth year], 52 y.o., female Today's Date: 02/27/2022  END OF SESSION:  PT End of Session - 02/27/22 1019     Visit Number 3    Date for PT Re-Evaluation 04/26/22    PT Start Time 1016    PT Stop Time 1055    PT Time Calculation (min) 39 min    Activity Tolerance Patient tolerated treatment well    Behavior During Therapy [facility] for tasks assessed/performed              Past Medical History:  Diagnosis Date   Allergic rhinitis    Asthma, mild intermittent    GERD (gastroesophageal reflux disease)    History of COVID-19 11/2018   per pt symptoms resolved   Hypertension    IBS (irritable bowel syndrome)    Seasonal allergies    Type 2 diabetes mellitus (HCC)    Vitamin D deficiency    Wears glasses    Past Surgical History:  Procedure Laterality Date   Quintella Reichert OSTEOTOMY Right 06/05/2021   Procedure: Quintella Reichert OSTEOTOMY;  Surgeon: Candelaria Stagers, DPM;  Location: [facility];  Service: Podiatry;  Laterality: Right;   BUNIONECTOMY Right 06/05/2021   Procedure: LAPIDUS BUNIONECTOMY;  Surgeon: Candelaria Stagers, DPM;  Location: [facility];  Service: Podiatry;  Laterality: Right;  BLOCK   FOOT SURGERY     NO PAST SURGERIES     Patient Active Problem List   Diagnosis Date Noted   Genetic testing 02/05/2020    PCP: Knox Royalty, MD  REFERRING PROVIDER: Otho Darner, MD  REFERRING DIAG:  Diagnosis  M54.50 (ICD-10-CM) - Lumbar pain  M54.2 (ICD-10-CM) - Cervical pain    Rationale for Evaluation and Treatment: Rehabilitation  THERAPY DIAG:  Abnormal posture  Difficulty in walking, not elsewhere classified  Muscle weakness (generalized)  Chronic right shoulder pain  Other low back pain  ONSET DATE: 01/24/22  SUBJECTIVE:                                                                                                                                                                                            SUBJECTIVE STATEMENT: Patient reports that she has not felt well. She has been her sister's caregiver after surgery and just feels tired and sore all over.  PERTINENT HISTORY:  Referred for neck and low back pain  PAIN:  Are you having pain? Yes: NPRS scale: 8/10 Pain location: Low back into R hip , also R shoulder Pain description: tight, pulling Aggravating factors: leaning over, walking Relieving  factors: Pain medicine, massage with [facility]  PRECAUTIONS: None  WEIGHT BEARING RESTRICTIONS: No  FALLS:  Has patient fallen in last 6 months? No  LIVING ENVIRONMENT: Lives with: lives with their family and lives with their spouse Lives in: House/apartment Stairs: Yes: Internal: 14 steps; on right going up and External: 5 steps; bilateral but cannot reach both Has following equipment at home: None  OCCUPATION: Nail tech, she cannot perform pedicures due to pain.  PLOF: Independent  PATIENT GOALS: Pain relief for her back  NEXT MD VISIT:   OBJECTIVE:   DIAGNOSTIC FINDINGS:  Patient reports that she had an x ray, but does not know the results.  SCREENING FOR RED FLAGS: Bowel or bladder incontinence: No Spinal tumors: No Cauda equina syndrome: No Compression fracture: No Abdominal aneurysm: No  COGNITION: Overall cognitive status: Within functional limits for tasks assessed     SENSATION: Light touch: Impaired R LE, she is not sure if this is from recent foot surgery.  MUSCLE LENGTH: Hamstrings: Right 40 deg; Left 50 deg Thomas test: WNL  POSTURE: decreased lumbar lordosis, decreased thoracic kyphosis, and anterior pelvic tilt  PALPATION: Very tight and TTP along B up traps, R supraspinatus, lumbar paraspinals, B gluts, R > L, R piriformis and R ITB. Lower trunk mobility severely limited due to guarding.  LUMBAR ROM:   AROM eval  Flexion Mid shin  Extension Painful to try  Right lateral  flexion Does not reach knee  Left lateral flexion Does not reach knee  Right rotation 1/2 way  Left rotation 1/2 way   (Blank rows = not tested)  LOWER EXTREMITY ROM:   Grossly WFL, but stiff and pulls on back with any hip movement   LOWER EXTREMITY MMT:  3+-(4-)/5, limited at times by back pain  SHOULDER SPECIAL TESTS; empty can and Hawkins (-)  LUMBAR SPECIAL TESTS:  Straight leg raise test: Positive and Slump test: Positive both on R  FUNCTIONAL TESTS:  5 times sit to stand: 22.24 Timed up and go (TUG): 12.45 Functional gait assessment: TBD  GAIT: Distance walked: in clinic Assistive device utilized: None Level of assistance: Complete Independence Comments: Slow, antalgic  TODAY'S TREATMENT:                                                                                                                              DATE:  02/27/22 Bike L3 x 6 minutes Supine with legs over physioball-DKTC and lower trunk rotation Moved to R side lie- L hip flexor stretch x 30 sec, then knee flexion x 30 sec, repeat in L side lie. Supine hip abd against G tband, 10 Supine bridge with G tband resistance at knees. 2 x 10 Standing hip flexor stretch facing the wall MH and IFC to low back for acute pain relief.   02/09/22 NuStep L5 x 6 minutes SKTC, DKTC, piriformis and glut stretches in supine Supine strengthening over physioball- Bridge, bridge with hip IR,  bridge with ball roll, 10 reps each. Standing B side step x 10 reps each way against G tband at knees Sit to stand with G tband at knees, 2# ball in hands, 10 reps MH and IFC to low back for pain relief, to tolerance, x 10 minutes.  02/01/22  HEP, Education  PATIENT EDUCATION:  Education details: POC, HEP Person educated: Patient Education method: Consulting civil engineer, Media planner, and Handouts Education comprehension: verbalized understanding and returned demonstration  HOME EXERCISE PROGRAM: [id number]  ASSESSMENT:  CLINICAL  IMPRESSION: Patient appears fatigued today. She feels worn out. Treatment emphasized stretching activities, but did include some strengthening. Finished with MH and estim to low back for acute pain relief. She did report improved pain after treatment.  OBJECTIVE IMPAIRMENTS: Abnormal gait, decreased activity tolerance, decreased coordination, decreased endurance, decreased mobility, difficulty walking, decreased ROM, decreased strength, increased muscle spasms, impaired flexibility, improper body mechanics, postural dysfunction, and pain.   ACTIVITY LIMITATIONS: carrying, lifting, standing, sleeping, stairs, reach over head, and locomotion level  PARTICIPATION LIMITATIONS: meal prep, cleaning, laundry, shopping, community activity, and occupation  PERSONAL FACTORS: Past/current experiences are also affecting patient's functional outcome.   REHAB POTENTIAL: Good  CLINICAL DECISION MAKING: Evolving/moderate complexity  EVALUATION COMPLEXITY: Moderate   GOALS: Goals reviewed with patient? Yes  SHORT TERM GOALS: Target date: 02/18/22  I with initial HEP Baseline: Goal status: met  LONG TERM GOALS: Target date: 04/26/22  I with final HEP Baseline:  Goal status: INITIAL  2.  Patient will be able to climb up and down her steps  in step over manner with back pain < 3/10 Baseline:  Goal status: ongoing  3.  5 x STS in < 12 sec Baseline: 22 Goal status: INITIAL  4.  Patient will report improved back pain, < 3/5 while performing her normal daily activities. Baseline:  Goal status: ongoing  5.  Patient will be able to walk x at least 400' on level and unlevel surfaces I with no C/O paib. Baseline:  Goal status: INITIAL  6.  R shoulder elevation to at least 120 degrees with pain < 3/10 Baseline:  Goal status: INITIAL  PLAN:  PT FREQUENCY: 2x/week  PT DURATION: 12 weeks  PLANNED INTERVENTIONS: Therapeutic exercises, Therapeutic activity, Neuromuscular re-education, Balance  training, Gait training, Patient/Family education, Self Care, Joint mobilization, Stair training, Dry Needling, and Manual therapy.  PLAN FOR NEXT SESSION: HEP   Marcelina Morel, DPT 02/27/2022, 10:57 AM

## 2022-03-07 ENCOUNTER — Ambulatory Visit: Payer: Medicaid Other | Admitting: Physical Therapy

## 2022-03-14 ENCOUNTER — Ambulatory Visit: Payer: Medicaid Other | Admitting: Physical Therapy

## 2022-03-14 ENCOUNTER — Encounter: Payer: Self-pay | Admitting: Physical Therapy

## 2022-03-14 DIAGNOSIS — M6281 Muscle weakness (generalized): Secondary | ICD-10-CM

## 2022-03-14 DIAGNOSIS — M5459 Other low back pain: Secondary | ICD-10-CM

## 2022-03-14 DIAGNOSIS — R293 Abnormal posture: Secondary | ICD-10-CM | POA: Diagnosis not present

## 2022-03-14 DIAGNOSIS — G8929 Other chronic pain: Secondary | ICD-10-CM

## 2022-03-14 DIAGNOSIS — R262 Difficulty in walking, not elsewhere classified: Secondary | ICD-10-CM

## 2022-03-14 NOTE — Therapy (Signed)
OUTPATIENT PHYSICAL THERAPY THORACOLUMBAR EVALUATION   Patient Name: [name] MRN: [id number] DOB:1971/01/03, 52 y.o., female Today's Date: 03/14/2022  END OF SESSION:  PT End of Session - 03/14/22 0936     Visit Number 4    Date for PT Re-Evaluation 04/26/22    PT Start Time 0932    PT Stop Time 1010    PT Time Calculation (min) 38 min    Activity Tolerance Patient tolerated treatment well    Behavior During Therapy [facility] for tasks assessed/performed              Past Medical History:  Diagnosis Date   Allergic rhinitis    Asthma, mild intermittent    GERD (gastroesophageal reflux disease)    History of COVID-19 11/2018   per pt symptoms resolved   Hypertension    IBS (irritable bowel syndrome)    Seasonal allergies    Type 2 diabetes mellitus ([place])    Vitamin D deficiency    Wears glasses    Past Surgical History:  Procedure Laterality Date   Barbie Banner OSTEOTOMY Right 06/05/2021   Procedure: Barbie Banner OSTEOTOMY;  Surgeon: Felipa Furnace, DPM;  Location: [place];  Service: Podiatry;  Laterality: Right;   BUNIONECTOMY Right 06/05/2021   Procedure: LAPIDUS BUNIONECTOMY;  Surgeon: Felipa Furnace, DPM;  Location: [place];  Service: Podiatry;  Laterality: Right;  BLOCK   FOOT SURGERY     NO PAST SURGERIES     Patient Active Problem List   Diagnosis Date Noted   Genetic testing 02/05/2020    PCP: Kristie Cowman, MD  REFERRING PROVIDER: Gentry Fitz, MD  REFERRING DIAG:  Diagnosis  M54.50 (ICD-10-CM) - Lumbar pain  M54.2 (ICD-10-CM) - Cervical pain    Rationale for Evaluation and Treatment: Rehabilitation  THERAPY DIAG:  Abnormal posture  Difficulty in walking, not elsewhere classified  Muscle weakness (generalized)  Other low back pain  Chronic right shoulder pain  ONSET DATE: 01/24/22  SUBJECTIVE:                                                                                                                                                                                            SUBJECTIVE STATEMENT: Patient reports that she still has a lot of stress. Her sister's health is still fragile and she is helping care for her. She is also working a lot. Her pain is worse in low back and also in her upper shoulders. She had to provide some pedicures at work and the position strained her back.   PERTINENT HISTORY:  Referred for neck and low back  pain  PAIN:  Are you having pain? Yes: NPRS scale: 8/10 Pain location: Low back into R hip , also R shoulder Pain description: tight, pulling Aggravating factors: leaning over, walking Relieving factors: Pain medicine, massage with [facility].  PRECAUTIONS: None  WEIGHT BEARING RESTRICTIONS: No  FALLS:  Has patient fallen in last 6 months? No  LIVING ENVIRONMENT: Lives with: lives with their family and lives with their spouse Lives in: House/apartment Stairs: Yes: Internal: 14 steps; on right going up and External: 5 steps; bilateral but cannot reach both Has following equipment at home: None  OCCUPATION: Nail tech, she cannot perform pedicures due to pain.  PLOF: Independent  PATIENT GOALS: Pain relief for her back  NEXT MD VISIT:   OBJECTIVE:   DIAGNOSTIC FINDINGS:  Patient reports that she had an x ray, but does not know the results.  SCREENING FOR RED FLAGS: Bowel or bladder incontinence: No Spinal tumors: No Cauda equina syndrome: No Compression fracture: No Abdominal aneurysm: No  COGNITION: Overall cognitive status: Within functional limits for tasks assessed     SENSATION: Light touch: Impaired R LE, she is not sure if this is from recent foot surgery.  MUSCLE LENGTH: Hamstrings: Right 40 deg; Left 50 deg Thomas test: WNL  POSTURE: decreased lumbar lordosis, decreased thoracic kyphosis, and anterior pelvic tilt  PALPATION: Very tight and TTP along B up traps, R supraspinatus, lumbar paraspinals, B gluts, R > L, R piriformis  and R ITB. Lower trunk mobility severely limited due to guarding.  LUMBAR ROM:   AROM eval  Flexion Mid shin  Extension Painful to try  Right lateral flexion Does not reach knee  Left lateral flexion Does not reach knee  Right rotation 1/2 way  Left rotation 1/2 way   (Blank rows = not tested)  LOWER EXTREMITY ROM:   Grossly WFL, but stiff and pulls on back with any hip movement   LOWER EXTREMITY MMT:  3+-(4-)/5, limited at times by back pain  SHOULDER SPECIAL TESTS; empty can and Hawkins (-)  LUMBAR SPECIAL TESTS:  Straight leg raise test: Positive and Slump test: Positive both on R  FUNCTIONAL TESTS:  5 times sit to stand: 22.24 Timed up and go (TUG): 12.45 Functional gait assessment: TBD  GAIT: Distance walked: in clinic Assistive device utilized: None Level of assistance: Complete Independence Comments: Slow, antalgic  TODAY'S TREATMENT:                                                                                                                              DATE:  03/14/22 NuStep L3 x 6 minutes Cat/cow x 8, improved with repetition Attempted child's pose, but she noted pain in R med scap Attempted thread the needle, but could not tolerate with R UE.  STM to R LS, Rhomboids, Up traps SKTC, DKTC x 3 each Seated lower body ant/post weight shifts x 8 Seated trunk ext with hands behind hips x 8 Doorway  stretch 60, 90, 120 Standing shoulder Ext, ER, and rows x 10 each, G tband   02/27/22 Bike L3 x 6 minutes Supine with legs over physioball-DKTC and lower trunk rotation Moved to R side lie- L hip flexor stretch x 30 sec, then knee flexion x 30 sec, repeat in L side lie. Supine hip abd against G tband, 10 Supine bridge with G tband resistance at knees. 2 x 10 Standing hip flexor stretch facing the wall MH and IFC to low back for acute pain relief.   02/09/22 NuStep L5 x 6 minutes SKTC, DKTC, piriformis and glut stretches in supine Supine strengthening over  physioball- Bridge, bridge with hip IR, bridge with ball roll, 10 reps each. Standing B side step x 10 reps each way against G tband at knees Sit to stand with G tband at knees, 2# ball in hands, 10 reps MH and IFC to low back for pain relief, to tolerance, x 10 minutes.  02/01/22  HEP, Education  PATIENT EDUCATION:  Education details: POC, HEP Person educated: Patient Education method: Consulting civil engineer, Media planner, and Handouts Education comprehension: verbalized understanding and returned demonstration  HOME EXERCISE PROGRAM: [id number]  ASSESSMENT:  CLINICAL IMPRESSION: Patient continues to deal with a lot of work and personal stress. Updated her HEP to include more upper body stretch and strengthening, also performed some STM to R shoulder area. Patient reported decreased pain and tightness after session.  OBJECTIVE IMPAIRMENTS: Abnormal gait, decreased activity tolerance, decreased coordination, decreased endurance, decreased mobility, difficulty walking, decreased ROM, decreased strength, increased muscle spasms, impaired flexibility, improper body mechanics, postural dysfunction, and pain.   ACTIVITY LIMITATIONS: carrying, lifting, standing, sleeping, stairs, reach over head, and locomotion level  PARTICIPATION LIMITATIONS: meal prep, cleaning, laundry, shopping, community activity, and occupation  PERSONAL FACTORS: Past/current experiences are also affecting patient's functional outcome.   REHAB POTENTIAL: Good  CLINICAL DECISION MAKING: Evolving/moderate complexity  EVALUATION COMPLEXITY: Moderate   GOALS: Goals reviewed with patient? Yes  SHORT TERM GOALS: Target date: 02/18/22  I with initial HEP Baseline: Goal status: met  LONG TERM GOALS: Target date: 04/26/22  I with final HEP Baseline:  Goal status: INITIAL  2.  Patient will be able to climb up and down her steps  in step over manner with back pain < 3/10 Baseline:  Goal status: ongoing  3.  5 x STS in  < 12 sec Baseline: 22 Goal status: INITIAL  4.  Patient will report improved back pain, < 3/5 while performing her normal daily activities. Baseline:  Goal status: ongoing  5.  Patient will be able to walk x at least 400' on level and unlevel surfaces I with no C/O paib. Baseline:  Goal status: INITIAL  6.  R shoulder elevation to at least 120 degrees with pain < 3/10 Baseline:  Goal status: INITIAL  PLAN:  PT FREQUENCY: 2x/week  PT DURATION: 12 weeks  PLANNED INTERVENTIONS: Therapeutic exercises, Therapeutic activity, Neuromuscular re-education, Balance training, Gait training, Patient/Family education, Self Care, Joint mobilization, Stair training, Dry Needling, and Manual therapy.  PLAN FOR NEXT SESSION: HEP   Marcelina Morel, DPT 03/14/2022, 10:17 AM

## 2022-03-20 ENCOUNTER — Ambulatory Visit: Payer: Medicaid Other | Admitting: Physical Therapy

## 2022-03-28 ENCOUNTER — Ambulatory Visit: Payer: Medicaid Other | Attending: Family Medicine | Admitting: Physical Therapy

## 2022-03-28 ENCOUNTER — Encounter: Payer: Self-pay | Admitting: Physical Therapy

## 2022-03-28 DIAGNOSIS — G8929 Other chronic pain: Secondary | ICD-10-CM | POA: Diagnosis present

## 2022-03-28 DIAGNOSIS — M25511 Pain in right shoulder: Secondary | ICD-10-CM | POA: Diagnosis present

## 2022-03-28 DIAGNOSIS — M5459 Other low back pain: Secondary | ICD-10-CM | POA: Diagnosis present

## 2022-03-28 DIAGNOSIS — M6281 Muscle weakness (generalized): Secondary | ICD-10-CM | POA: Diagnosis present

## 2022-03-28 DIAGNOSIS — R262 Difficulty in walking, not elsewhere classified: Secondary | ICD-10-CM | POA: Diagnosis present

## 2022-03-28 DIAGNOSIS — R293 Abnormal posture: Secondary | ICD-10-CM | POA: Diagnosis present

## 2022-03-28 NOTE — Therapy (Signed)
OUTPATIENT PHYSICAL THERAPY THORACOLUMBAR EVALUATION   Patient Name: [name] MRN: [id number] DOB:1970/08/18, 52 y.o., female Today's Date: 03/28/2022  END OF SESSION:  PT End of Session - 03/28/22 1021     Visit Number 5    Date for PT Re-Evaluation 04/26/22    PT Start Time 1018    PT Stop Time 1058    PT Time Calculation (min) 40 min    Activity Tolerance Patient tolerated treatment well    Behavior During Therapy [facility] for tasks assessed/performed;Restless              Past Medical History:  Diagnosis Date   Allergic rhinitis    Asthma, mild intermittent    GERD (gastroesophageal reflux disease)    History of COVID-19 11/2018   per pt symptoms resolved   Hypertension    IBS (irritable bowel syndrome)    Seasonal allergies    Type 2 diabetes mellitus ([place])    Vitamin D deficiency    Wears glasses    Past Surgical History:  Procedure Laterality Date   Barbie Banner OSTEOTOMY Right 06/05/2021   Procedure: Barbie Banner OSTEOTOMY;  Surgeon: Felipa Furnace, DPM;  Location: [place];  Service: Podiatry;  Laterality: Right;   BUNIONECTOMY Right 06/05/2021   Procedure: LAPIDUS BUNIONECTOMY;  Surgeon: Felipa Furnace, DPM;  Location: [place];  Service: Podiatry;  Laterality: Right;  BLOCK   FOOT SURGERY     NO PAST SURGERIES     Patient Active Problem List   Diagnosis Date Noted   Genetic testing 02/05/2020    PCP: Kristie Cowman, MD  REFERRING PROVIDER: Gentry Fitz, MD  REFERRING DIAG:  Diagnosis  M54.50 (ICD-10-CM) - Lumbar pain  M54.2 (ICD-10-CM) - Cervical pain    Rationale for Evaluation and Treatment: Rehabilitation  THERAPY DIAG:  Abnormal posture  Difficulty in walking, not elsewhere classified  Muscle weakness (generalized)  Chronic right shoulder pain  Other low back pain  ONSET DATE: 01/24/22  SUBJECTIVE:                                                                                                                                                                                            SUBJECTIVE STATEMENT: Patient reports that her sister's health is improving. She has a headache today. Her shoulder feels better. Her back feels better, but is still aggravated by working.  PERTINENT HISTORY:  Referred for neck and low back pain  PAIN:  Are you having pain? Yes: NPRS scale: 8/10 Pain location: Low back into R hip , also R shoulder Pain description: tight, pulling Aggravating factors: leaning  over, walking Relieving factors: Pain medicine, massage with [facility].  PRECAUTIONS: None  WEIGHT BEARING RESTRICTIONS: No  FALLS:  Has patient fallen in last 6 months? No  LIVING ENVIRONMENT: Lives with: lives with their family and lives with their spouse Lives in: House/apartment Stairs: Yes: Internal: 14 steps; on right going up and External: 5 steps; bilateral but cannot reach both Has following equipment at home: None  OCCUPATION: Nail tech, she cannot perform pedicures due to pain.  PLOF: Independent  PATIENT GOALS: Pain relief for her back  NEXT MD VISIT:   OBJECTIVE:   DIAGNOSTIC FINDINGS:  Patient reports that she had an x ray, but does not know the results.  SCREENING FOR RED FLAGS: Bowel or bladder incontinence: No Spinal tumors: No Cauda equina syndrome: No Compression fracture: No Abdominal aneurysm: No  COGNITION: Overall cognitive status: Within functional limits for tasks assessed     SENSATION: Light touch: Impaired R LE, she is not sure if this is from recent foot surgery.  MUSCLE LENGTH: Hamstrings: Right 40 deg; Left 50 deg Thomas test: WNL  POSTURE: decreased lumbar lordosis, decreased thoracic kyphosis, and anterior pelvic tilt  PALPATION: Very tight and TTP along B up traps, R supraspinatus, lumbar paraspinals, B gluts, R > L, R piriformis and R ITB. Lower trunk mobility severely limited due to guarding.  LUMBAR ROM:   AROM eval  Flexion Mid shin   Extension Painful to try  Right lateral flexion Does not reach knee  Left lateral flexion Does not reach knee  Right rotation 1/2 way  Left rotation 1/2 way   (Blank rows = not tested)  LOWER EXTREMITY ROM:   Grossly WFL, but stiff and pulls on back with any hip movement   LOWER EXTREMITY MMT:  3+-(4-)/5, limited at times by back pain  SHOULDER SPECIAL TESTS; empty can and Hawkins (-)  LUMBAR SPECIAL TESTS:  Straight leg raise test: Positive and Slump test: Positive both on R  FUNCTIONAL TESTS:  5 times sit to stand: 22.24 Timed up and go (TUG): 12.45 Functional gait assessment: TBD  GAIT: Distance walked: in clinic Assistive device utilized: None Level of assistance: Complete Independence Comments: Slow, antalgic  TODAY'S TREATMENT:                                                                                                                              DATE:  03/28/22 Bike L3 x 5 minutes Dyna disc lower body mobilization-ant/post, lat, circular x 10 each Dead lifts from 10" height, 4#, x 10 Squats with 4# touching 12" surface-mod VC for correct form. Supine bridge with G tband resistance at knees x 10 Supine, pressing hands in physioball for abdominal control x 10 Dead bus x 10 each side, amble ot maintain stable pelvic tilt MH to neck and LB for acute pain control.  03/14/22 NuStep L3 x 6 minutes Cat/cow x 8, improved with repetition Attempted child's pose, but she noted pain  in R med scap Attempted thread the needle, but could not tolerate with R UE.  STM to R LS, Rhomboids, Up traps SKTC, DKTC x 3 each Seated lower body ant/post weight shifts x 8 Seated trunk ext with hands behind hips x 8 Doorway stretch 60, 90, 120 Standing shoulder Ext, ER, and rows x 10 each, G tband   02/27/22 Bike L3 x 6 minutes Supine with legs over physioball-DKTC and lower trunk rotation Moved to R side lie- L hip flexor stretch x 30 sec, then knee flexion x 30 sec, repeat in L  side lie. Supine hip abd against G tband, 10 Supine bridge with G tband resistance at knees. 2 x 10 Standing hip flexor stretch facing the wall MH and IFC to low back for acute pain relief.   02/09/22 NuStep L5 x 6 minutes SKTC, DKTC, piriformis and glut stretches in supine Supine strengthening over physioball- Bridge, bridge with hip IR, bridge with ball roll, 10 reps each. Standing B side step x 10 reps each way against G tband at knees Sit to stand with G tband at knees, 2# ball in hands, 10 reps MH and IFC to low back for pain relief, to tolerance, x 10 minutes.  02/01/22  HEP, Education  PATIENT EDUCATION:  Education details: POC, HEP Person educated: Patient Education method: Consulting civil engineer, Media planner, and Handouts Education comprehension: verbalized understanding and returned demonstration  HOME EXERCISE PROGRAM: [id number]  ASSESSMENT:  CLINICAL IMPRESSION: Patient reports improvement overall. Slightly less stress at home. LBP continues to be aggravated at work. She also reported a headache today. Performed multiple stabilization activities for trunk and postural control activities, demonstrated improved mobility and stability.  OBJECTIVE IMPAIRMENTS: Abnormal gait, decreased activity tolerance, decreased coordination, decreased endurance, decreased mobility, difficulty walking, decreased ROM, decreased strength, increased muscle spasms, impaired flexibility, improper body mechanics, postural dysfunction, and pain.   ACTIVITY LIMITATIONS: carrying, lifting, standing, sleeping, stairs, reach over head, and locomotion level  PARTICIPATION LIMITATIONS: meal prep, cleaning, laundry, shopping, community activity, and occupation  PERSONAL FACTORS: Past/current experiences are also affecting patient's functional outcome.   REHAB POTENTIAL: Good  CLINICAL DECISION MAKING: Evolving/moderate complexity  EVALUATION COMPLEXITY: Moderate   GOALS: Goals reviewed with patient?  Yes  SHORT TERM GOALS: Target date: 02/18/22  I with initial HEP Baseline: Goal status: met  LONG TERM GOALS: Target date: 04/26/22  I with final HEP Baseline:  Goal status: INITIAL  2.  Patient will be able to climb up and down her steps  in step over manner with back pain < 3/10 Baseline:  Goal status: ongoing  3.  5 x STS in < 12 sec Baseline: 22 Goal status: 03/28/22- ongoing  4.  Patient will report improved back pain, < 3/5 while performing her normal daily activities. Baseline:  Goal status: 03/28/22-ongoing  5.  Patient will be able to walk x at least 400' on level and unlevel surfaces I with no C/O paib. Baseline:  Goal status: INITIAL  6.  R shoulder elevation to at least 120 degrees with pain < 3/10 Baseline:  Goal status: INITIAL  PLAN:  PT FREQUENCY: 2x/week  PT DURATION: 12 weeks  PLANNED INTERVENTIONS: Therapeutic exercises, Therapeutic activity, Neuromuscular re-education, Balance training, Gait training, Patient/Family education, Self Care, Joint mobilization, Stair training, Dry Needling, and Manual therapy.  PLAN FOR NEXT SESSION: HEP   Marcelina Morel, DPT 03/28/2022, 10:49 AM

## 2022-04-03 ENCOUNTER — Ambulatory Visit: Payer: Medicaid Other

## 2022-04-04 ENCOUNTER — Ambulatory Visit: Payer: Medicaid Other | Admitting: Physical Therapy

## 2022-04-11 ENCOUNTER — Ambulatory Visit: Payer: Medicaid Other | Admitting: Physical Therapy

## 2023-03-20 ENCOUNTER — Other Ambulatory Visit: Payer: Self-pay | Admitting: Family Medicine

## 2023-03-20 DIAGNOSIS — Z1231 Encounter for screening mammogram for malignant neoplasm of breast: Secondary | ICD-10-CM

## 2023-03-22 ENCOUNTER — Encounter: Payer: Self-pay | Admitting: Physical Therapy

## 2023-04-03 ENCOUNTER — Ambulatory Visit: Payer: Medicaid Other

## 2023-04-10 ENCOUNTER — Ambulatory Visit: Payer: Medicaid Other

## 2023-04-17 ENCOUNTER — Ambulatory Visit: Payer: Medicaid Other

## 2023-05-01 ENCOUNTER — Ambulatory Visit
Admission: RE | Admit: 2023-05-01 | Discharge: 2023-05-01 | Disposition: A | Discharge status: Home / Self Care | Source: Ambulatory Visit | Attending: Family Medicine | Admitting: Family Medicine

## 2023-05-01 DIAGNOSIS — Z1231 Encounter for screening mammogram for malignant neoplasm of breast: Secondary | ICD-10-CM

## 2023-11-26 ENCOUNTER — Other Ambulatory Visit (HOSPITAL_COMMUNITY): Payer: Self-pay | Admitting: Internal Medicine

## 2023-11-26 DIAGNOSIS — R079 Chest pain, unspecified: Secondary | ICD-10-CM

## 2023-12-02 ENCOUNTER — Telehealth (HOSPITAL_COMMUNITY): Payer: Self-pay | Admitting: Emergency Medicine

## 2023-12-02 NOTE — Telephone Encounter (Signed)
 Attempted to call patient regarding upcoming cardiac CT appointment. Left message on voicemail with name and callback number Rockwell Alexandria RN Navigator Cardiac Imaging [facility] [phone] Office [phone] Cell

## 2023-12-03 ENCOUNTER — Ambulatory Visit (HOSPITAL_COMMUNITY)
Admission: RE | Admit: 2023-12-03 | Discharge: 2023-12-03 | Disposition: A | Discharge status: Home / Self Care | Source: Ambulatory Visit | Attending: Internal Medicine | Admitting: Internal Medicine

## 2023-12-03 DIAGNOSIS — I2510 Atherosclerotic heart disease of native coronary artery without angina pectoris: Secondary | ICD-10-CM | POA: Diagnosis not present

## 2023-12-03 DIAGNOSIS — R079 Chest pain, unspecified: Secondary | ICD-10-CM

## 2023-12-03 MED ORDER — DILTIAZEM HCL 25 MG/5ML IV SOLN
10.0000 mg | INTRAVENOUS | Status: AC | PRN
  Administered 2023-12-03 (×2): 10 mg via INTRAVENOUS

## 2023-12-03 MED ORDER — NITROGLYCERIN 0.4 MG SL SUBL
0.8000 mg | SUBLINGUAL_TABLET | Freq: Once | SUBLINGUAL | Status: AC
  Administered 2023-12-03: 0.8 mg via SUBLINGUAL

## 2023-12-03 MED ORDER — IOHEXOL 350 MG/ML SOLN
100.0000 mL | Freq: Once | INTRAVENOUS | Status: AC | PRN
  Administered 2023-12-03: 100 mL via INTRAVENOUS

## 2024-03-04 ENCOUNTER — Emergency Department (HOSPITAL_COMMUNITY)

## 2024-03-04 ENCOUNTER — Emergency Department (HOSPITAL_COMMUNITY)
Admission: EM | Admit: 2024-03-04 | Discharge: 2024-03-04 | Discharge status: Against Medical Advice | Attending: Emergency Medicine | Admitting: Emergency Medicine

## 2024-03-04 DIAGNOSIS — R509 Fever, unspecified: Secondary | ICD-10-CM | POA: Insufficient documentation

## 2024-03-04 DIAGNOSIS — R0981 Nasal congestion: Secondary | ICD-10-CM | POA: Diagnosis not present

## 2024-03-04 DIAGNOSIS — Z5321 Procedure and treatment not carried out due to patient leaving prior to being seen by health care provider: Secondary | ICD-10-CM | POA: Insufficient documentation

## 2024-03-04 DIAGNOSIS — R059 Cough, unspecified: Secondary | ICD-10-CM | POA: Insufficient documentation

## 2024-03-04 LAB — RESP PANEL BY RT-PCR (RSV, FLU A&B, COVID)  RVPGX2
Influenza A by PCR: POSITIVE — AB
Influenza B by PCR: NEGATIVE
Resp Syncytial Virus by PCR: NEGATIVE
SARS Coronavirus 2 by RT PCR: NEGATIVE

## 2024-03-04 NOTE — ED Triage Notes (Signed)
 Concern for lingering URI for one week. Had pneumonia in the past, concerned for same. Cough, congestion, fevers.

## 2024-03-04 NOTE — ED Provider Triage Note (Signed)
 Emergency Medicine Provider Triage Evaluation Note  [name] , a 54 y.o. female  was evaluated in triage.  Pt complains of flulike symptoms.  Patient reports URI symptoms for about 1 week.  States had pneumonia previously and is concerned for possible pneumonia.  Dors continued cough, congestion, fever, nose and bodyaches.  Reports family numbers at home were sick with similar symptoms but they have improved and she continues to have the symptoms.  Review of Systems  Positive: As above Negative: As above  Physical Exam  BP 126/71 (BP Location: Left Arm)   Pulse 80   Temp 97.7 F (36.5 C) (Oral)   Resp 18   SpO2 97%  Gen:   Awake, no distress  Resp:  Normal effort, no wheezing, rales, or rhonchi MSK:   Moves extremities without difficulty  Other:    Medical Decision Making  Medically screening exam initiated at 5:36 PM.  Appropriate orders placed.  [name] was informed that the remainder of the evaluation will be completed by another provider, this initial triage assessment does not replace that evaluation, and the importance of remaining in the ED until their evaluation is complete.     Amiri Tritch A, PA-C 03/04/24 1737
